# Patient Record
Sex: Female | Born: 2000 | Race: White | Hispanic: No | Marital: Single | Smoking: Never smoker
Health system: Southern US, Community
[De-identification: ages and names within clinical notes are randomized; demographics above are authoritative.]

## PROBLEM LIST (undated history)

## (undated) DIAGNOSIS — F32A Depression, unspecified: Secondary | ICD-10-CM

## (undated) DIAGNOSIS — D689 Coagulation defect, unspecified: Secondary | ICD-10-CM

## (undated) DIAGNOSIS — F419 Anxiety disorder, unspecified: Secondary | ICD-10-CM

## (undated) DIAGNOSIS — N946 Dysmenorrhea, unspecified: Secondary | ICD-10-CM

## (undated) DIAGNOSIS — Z8481 Family history of carrier of genetic disease: Secondary | ICD-10-CM

## (undated) HISTORY — PX: OTHER SURGICAL HISTORY: SHX169

## (undated) HISTORY — PX: EYE SURGERY: SHX253

## (undated) HISTORY — DX: Family history of carrier of genetic disease: Z84.81

## (undated) HISTORY — DX: Depression, unspecified: F32.A

## (undated) HISTORY — DX: Anxiety disorder, unspecified: F41.9

## (undated) HISTORY — DX: Coagulation defect, unspecified: D68.9

## (undated) HISTORY — DX: Dysmenorrhea, unspecified: N94.6

---
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
## 2020-05-30 ENCOUNTER — Ambulatory Visit: Payer: Self-pay | Attending: Internal Medicine

## 2020-05-30 DIAGNOSIS — Z23 Encounter for immunization: Secondary | ICD-10-CM

## 2020-05-30 NOTE — Progress Notes (Signed)
   Covid-19 Vaccination Clinic  Name:  Erin Montgomery    MRN: [id number] DOB: May 22, 2000  05/30/2020  [name] was observed post Covid-19 immunization for 30 minutes based on pre-vaccination screening without incident. She was provided with Vaccine Information Sheet and instruction to access the V-Safe system.   [name] was instructed to call 911 with any severe reactions post vaccine: Marland Kitchen Difficulty breathing  . Swelling of face and throat  . A fast heartbeat  . A bad rash all over body  . Dizziness and weakness   Immunizations Administered    Name Date Dose VIS Date Route   Moderna Covid-19 Booster Vaccine 05/30/2020  2:09 PM 0.25 mL 11/15/2019 Intramuscular   Manufacturer: Moderna   Lot: [id number]   [place]: 73419-379-02

## 2020-06-04 ENCOUNTER — Other Ambulatory Visit (HOSPITAL_BASED_OUTPATIENT_CLINIC_OR_DEPARTMENT_OTHER): Payer: Self-pay

## 2020-06-04 MED ORDER — MODERNA COVID-19 VACCINE 100 MCG/0.5ML IM SUSP
INTRAMUSCULAR | 0 refills | Status: DC
  Filled 2020-06-04: qty 0.25, 1d supply, fill #0

## 2020-06-06 ENCOUNTER — Encounter: Payer: Self-pay | Admitting: Nurse Practitioner

## 2020-06-06 ENCOUNTER — Other Ambulatory Visit: Payer: Self-pay

## 2020-06-06 ENCOUNTER — Ambulatory Visit (INDEPENDENT_AMBULATORY_CARE_PROVIDER_SITE_OTHER): Payer: 59 | Admitting: Nurse Practitioner

## 2020-06-06 VITALS — BP 109/70 | HR 85 | Temp 98.8°F | Ht 63.0 in | Wt 229.3 lb

## 2020-06-06 DIAGNOSIS — N912 Amenorrhea, unspecified: Secondary | ICD-10-CM | POA: Diagnosis not present

## 2020-06-06 DIAGNOSIS — F411 Generalized anxiety disorder: Secondary | ICD-10-CM | POA: Diagnosis not present

## 2020-06-06 DIAGNOSIS — F909 Attention-deficit hyperactivity disorder, unspecified type: Secondary | ICD-10-CM | POA: Diagnosis not present

## 2020-06-06 DIAGNOSIS — D6851 Activated protein C resistance: Secondary | ICD-10-CM | POA: Diagnosis not present

## 2020-06-06 DIAGNOSIS — Z9151 Personal history of suicidal behavior: Secondary | ICD-10-CM

## 2020-06-06 DIAGNOSIS — H538 Other visual disturbances: Secondary | ICD-10-CM

## 2020-06-06 DIAGNOSIS — Z86718 Personal history of other venous thrombosis and embolism: Secondary | ICD-10-CM

## 2020-06-06 DIAGNOSIS — Z7689 Persons encountering health services in other specified circumstances: Secondary | ICD-10-CM | POA: Diagnosis not present

## 2020-06-06 MED ORDER — APIXABAN 2.5 MG PO TABS: 2.5000 mg | ORAL_TABLET | Freq: Two times a day (BID) | ORAL | 1 refills | Status: DC

## 2020-06-06 NOTE — Progress Notes (Signed)
New Patient Office Visit  Subjective:  Patient ID: Erin Montgomery, adult    DOB: Jun 20, 2000  Age: 20 y.o. MRN: [id number]  CC:  Chief Complaint  Patient presents with  . New Patient (Initial Visit)    HPI SHALAMAR PLOURDE presents to establish new primary care provider. They are currently staying with their parents in the area while on summer break from college. They are getting ready to leave for an overseas trip to the [place] and [place]. This is a trip for school, studying the [place]. They plan to return to college and use findings to improve the [place] of [place]. The patient states that they just received 2nd booster for COVID 19.  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 At one point, they were taking Eliquis twice daily, but has been off of this medication. They do not believe they have developed any new blood clots. Has not seen hematology for some time.  They have long history of generalized anxiety disorder, depression, and suicide attempts. They have seen psychiatry and counseling services while on campus in [place]. They do not have mental health specialist of this sort in the area. They are currently not on any medications to treat anxiety and depression. They are currently not suicidal. Would like referral to psychiatry and mental health.  The patient states that they have not had menstrual cycle for the past 6 months or so. In the past, this has been mentioned to parents and previous provider. It was suggested that they may have polycystic ovary syndrome. No testing was ever performed to their knowledge. They state that family members have history of very irregular menstrual cycles as well.  The patient reports generalized joint pain and tenderness. This gets worse when standing for long times or with mild exertion. They state that many of the joints "pop and crack" without any good explanation.  The popping and cracking is painful.  The patient denies chest pain, chest pressure, shortness of breath or headaches. They do have intermittent periods of nausea and slight dizziness. They state that they have been evaluated for anemia and tests were negative. With further investigation, last full set of blood work was done a few years ago.   Past Medical History:  Diagnosis Date  . Anxiety   . Clotting disorder ([place])    Factor V Leiden  . Depression     History reviewed. No pertinent surgical history.  Family History  Problem Relation Age of Onset  . Alcoholism Mother   . Heart attack Mother   . Depression Mother   . High blood pressure Mother   . High Cholesterol Mother   . High blood pressure Father   . High Cholesterol Father   . Heart attack Maternal Grandmother   . Heart attack Maternal Grandfather   . Depression Maternal Grandfather   . Alcoholism Paternal Grandmother     Social History   Socioeconomic History  . Marital status: Single    Spouse name: Not on file  . Number of children: Not on file  . Years of education: Not on file  . Highest education level: Not on file  Occupational History  . Not on file  Tobacco Use  . Smoking status: Never Smoker  . Smokeless tobacco: Never Used  Substance and Sexual Activity  . Alcohol use: Never  . Drug use: Never  . Sexual activity: Not Currently  Other Topics Concern  .  Not on file  Social History Narrative  . Not on file   Social Determinants of Health   Financial Resource Strain: Not on file  Food Insecurity: Not on file  Transportation Needs: Not on file  Physical Activity: Not on file  Stress: Not on file  Social Connections: Not on file  Intimate Partner Violence: Not on file    ROS Review of Systems  Constitutional: Positive for fatigue. Negative for activity change, chills, fever and unexpected weight change.  HENT: Negative.  Negative for congestion and sinus pain.   Eyes: Negative.    Respiratory: Negative for cough and wheezing.   Cardiovascular: Negative for chest pain and palpitations.  Gastrointestinal: Positive for nausea. Negative for constipation, diarrhea and vomiting.  Endocrine: Negative for cold intolerance, heat intolerance, polydipsia and polyuria.  Genitourinary: Positive for menstrual problem.       Patient states they have not had a menstrual cycle in past 6 months or so.   Musculoskeletal: Positive for arthralgias and myalgias. Negative for back pain.       Generalized joint pain and popping and cracking of multiple joints.   Skin: Negative for rash.  Allergic/Immunologic: Negative.   Neurological: Positive for dizziness. Negative for weakness and headaches.  Hematological: Negative.   Psychiatric/Behavioral: Positive for dysphoric mood. The patient is nervous/anxious.   All other systems reviewed and are negative.   Objective:   Today's Vitals   06/06/20 1450  BP: 109/70  Pulse: 85  Temp: 98.8 F (37.1 C)  SpO2: 100%  Weight: 229 lb 4.8 oz (104 kg)  Height: 5\' 3"  (1.6 m)   Body mass index is 40.62 kg/m.  Physical Exam Vitals and nursing note reviewed.  Constitutional:      Appearance: Normal appearance. NIRVI BOEHLER "KJ" is well-developed. REIGHAN HIPOLITO "KJ" is obese.  HENT:     Head: Normocephalic and atraumatic.  Eyes:     Extraocular Movements: Extraocular movements intact.     Conjunctiva/sclera: Conjunctivae normal.     Pupils: Pupils are equal, round, and reactive to light.  Cardiovascular:     Rate and Rhythm: Normal rate and regular rhythm.     Pulses: Normal pulses.     Heart sounds: Normal heart sounds.  Pulmonary:     Effort: Pulmonary effort is normal.     Breath sounds: Normal breath sounds.  Abdominal:     Palpations: Abdomen is soft.  Musculoskeletal:        General: Normal range of motion.     Cervical back: Normal range of motion and neck supple.  Lymphadenopathy:     Cervical: No cervical adenopathy.   Skin:    General: Skin is warm and dry.     Capillary Refill: Capillary refill takes less than 2 seconds.  Neurological:     General: No focal deficit present.     Mental Status: RENITA BROCKS "KJ" is alert and oriented to person, place, and time.  Psychiatric:        Mood and Affect: Mood normal.        Behavior: Behavior normal.        Thought Content: Thought content normal.        Judgment: Judgment normal.     Assessment & Plan:  1. Encounter to establish care Appointment today to establish primary care provider  2. Factor V Leiden ([place]) Restart eliquis 2.5mg  twice daily. Refer to hematology for further evaluation and management.  - apixaban (ELIQUIS) 2.5 MG TABS tablet;  Take 1 tablet (2.5 mg total) by mouth 2 (two) times daily.  Dispense: 60 tablet; Refill: 1 - Ambulatory referral to Hematology / Oncology  3. History of DVT in adulthood Check D. Dimer with labs. Start preventive eliquis 2.5mg  twice daily. Refer to hematology for further evaluation and management - Ambulatory referral to Hematology / Oncology  4. Amenorrhea Check reproductive hormones including FSH, LH, estradiol, and prolactin level. Will consider getting pelvic ultrasound based on lab results.   5. Generalized anxiety disorder Will get referral to psychiatry for further evaluation and treatment.  - Ambulatory referral to Psychiatry  6. Attention deficit hyperactivity disorder (ADHD), unspecified ADHD type Referral made today to psychiatry for further evaluation and treatment.  - Ambulatory referral to Psychiatry  7. History of suicide attempt Patient currently not suicidal. Will refer to psychiatry for further evaluation and treatment.  - Ambulatory referral to Psychiatry  Problem List Items Addressed This Visit      Hematopoietic and Hemostatic   Factor V Leiden ([place])   Relevant Medications   apixaban (ELIQUIS) 2.5 MG TABS tablet   Other Relevant Orders   Ambulatory referral to Hematology /  Oncology     Other   Encounter to establish care - Primary   History of DVT in adulthood   Relevant Orders   Ambulatory referral to Hematology / Oncology   Amenorrhea   Generalized anxiety disorder   Relevant Orders   Ambulatory referral to Psychiatry   Attention deficit hyperactivity disorder (ADHD)   Relevant Orders   Ambulatory referral to Psychiatry   History of suicide attempt   Relevant Orders   Ambulatory referral to Psychiatry      Outpatient Encounter Medications as of 06/06/2020  Medication Sig  . apixaban (ELIQUIS) 2.5 MG TABS tablet Take 1 tablet (2.5 mg total) by mouth 2 (two) times daily.  Marland Kitchen COVID-19 mRNA vaccine, Moderna, (MODERNA COVID-19 VACCINE) 100 MCG/0.5ML injection Inject into the muscle.   No facility-administered encounter medications on file as of 06/06/2020.    Follow-up: Return in about 1 day (around 06/07/2020) for labs- cbc, ana,RA, sed rate, d. dimer, cmet, estrogen, FSH/LH,  prolactin, lipid (see below).   Ronnell Freshwater, NP

## 2020-06-06 NOTE — Patient Instructions (Signed)
Polycystic Ovary Syndrome  Polycystic ovarian syndrome (PCOS) is a common hormonal disorder among women of reproductive age. In most women with PCOS, small fluid-filled sacs (cysts) grow on the ovaries. PCOS can cause problems with menstrual periods and make it hard to get and stay pregnant. If this condition is not treated, it can lead to serious health problems, such as diabetes and heart disease. What are the causes? The cause of this condition is not known. It may be due to certain factors, such as:  Irregular menstrual cycle.  High levels of certain hormones.  Problems with the hormone that helps to control blood sugar (insulin).  Certain genes. What increases the risk? You are more likely to develop this condition if you:  Have a family history of PCOS or type 2 diabetes.  Are overweight, eat unhealthy foods, and are not active. These factors may cause problems with blood sugar control, which can contribute to PCOS or PCOS symptoms. What are the signs or symptoms? Symptoms of this condition include:  Ovarian cysts and sometimes pelvic pain.  Menstrual periods that are not regular or are too heavy.  Inability to get or stay pregnant.  Increased growth of hair on the face, chest, stomach, back, thumbs, thighs, or toes.  Acne or oily skin. Acne may develop during adulthood, and it may not get better with treatment.  Weight gain or obesity.  Patches of thickened and dark brown or black skin on the neck, arms, breasts, or thighs. How is this diagnosed? This condition is diagnosed based on:  Your medical history.  A physical exam that includes a pelvic exam. Your health care provider may look for areas of increased hair growth on your skin.  Tests, such as: ? An ultrasound to check the ovaries for cysts and to view the lining of the uterus. ? Blood tests to check levels of sugar (glucose), female hormone (testosterone), and female hormones (estrogen and progesterone). How  is this treated? There is no cure for this condition, but treatment can help to manage symptoms and prevent more health problems from developing. Treatment varies depending on your symptoms and if you want to have a baby or if you need birth control. Treatment may include:  Making nutrition and lifestyle changes.  Taking the progesterone hormone to start a menstrual period.  Taking birth control pills to help you have regular menstrual periods.  Taking medicines such as: ? Medicines to make you ovulate, if you want to get pregnant. ? Medicine to reduce extra hair growth.  Having surgery in severe cases. This may involve making small holes in one or both of your ovaries. This decreases the amount of testosterone that your body makes. Follow these instructions at home:  Take over-the-counter and prescription medicines only as told by your health care provider.  Follow a healthy meal plan that includes lean proteins, complex carbohydrates, fresh fruits and vegetables, low-fat dairy products, healthy fats, and fiber.  If you are overweight, lose weight as told by your health care provider. Your health care provider can determine how much weight loss is best for you and can help you lose weight safely.  Keep all follow-up visits. This is important. Contact a health care provider if:  Your symptoms do not get better with medicine.  Your symptoms get worse or you develop new symptoms. Summary  Polycystic ovarian syndrome (PCOS) is a common hormonal disorder among women of reproductive age.  PCOS can cause problems with menstrual periods and make it hard  to get and stay pregnant.  If this condition is not treated, it can lead to serious health problems, such as diabetes and heart disease.  There is no cure for this condition, but treatment can help to manage symptoms and prevent more health problems from developing. This information is not intended to replace advice given to you by your  health care provider. Make sure you discuss any questions you have with your health care provider. Document Revised: 06/22/2019 Document Reviewed: 06/22/2019 Elsevier Patient Education  2021 [employer].

## 2020-06-07 ENCOUNTER — Other Ambulatory Visit: Payer: Self-pay

## 2020-06-07 ENCOUNTER — Encounter: Payer: Self-pay | Admitting: Nurse Practitioner

## 2020-06-07 DIAGNOSIS — Z86718 Personal history of other venous thrombosis and embolism: Secondary | ICD-10-CM

## 2020-06-07 DIAGNOSIS — F909 Attention-deficit hyperactivity disorder, unspecified type: Secondary | ICD-10-CM | POA: Insufficient documentation

## 2020-06-07 DIAGNOSIS — Z0000 Encounter for general adult medical examination without abnormal findings: Secondary | ICD-10-CM

## 2020-06-07 DIAGNOSIS — N912 Amenorrhea, unspecified: Secondary | ICD-10-CM | POA: Insufficient documentation

## 2020-06-07 DIAGNOSIS — D6851 Activated protein C resistance: Secondary | ICD-10-CM

## 2020-06-07 DIAGNOSIS — Z7689 Persons encountering health services in other specified circumstances: Secondary | ICD-10-CM | POA: Diagnosis not present

## 2020-06-07 DIAGNOSIS — Z9151 Personal history of suicidal behavior: Secondary | ICD-10-CM | POA: Insufficient documentation

## 2020-06-07 DIAGNOSIS — F411 Generalized anxiety disorder: Secondary | ICD-10-CM | POA: Insufficient documentation

## 2020-06-08 ENCOUNTER — Encounter: Payer: Self-pay | Admitting: Nurse Practitioner

## 2020-06-09 ENCOUNTER — Encounter: Payer: Self-pay | Admitting: Nurse Practitioner

## 2020-06-09 NOTE — Progress Notes (Signed)
D.dimer mildly elevated. History of Factor V Leiden. Restarted eliquis 2.5mg  twice daily. Will get further evaluation when she returns from overseas trip. Prolactin also elevated. Likely contributing to amenorrhea. Will get CT or MRI of the brain for evaluation of pituitary upon return to office. MyChart message sent to patient.

## 2020-06-10 LAB — COMPREHENSIVE METABOLIC PANEL
ALT: 32 IU/L (ref 0–32)
AST: 18 IU/L (ref 0–40)
Albumin/Globulin Ratio: 1.9 (ref 1.2–2.2)
Albumin: 4.6 g/dL (ref 3.9–5.0)
Alkaline Phosphatase: 88 IU/L (ref 42–106)
BUN/Creatinine Ratio: 22 (ref 9–23)
BUN: 15 mg/dL (ref 6–20)
Bilirubin Total: 0.3 mg/dL (ref 0.0–1.2)
CO2: 22 mmol/L (ref 20–29)
Calcium: 10.1 mg/dL (ref 8.7–10.2)
Chloride: 99 mmol/L (ref 96–106)
Creatinine, Ser: 0.68 mg/dL (ref 0.57–1.00)
Globulin, Total: 2.4 g/dL (ref 1.5–4.5)
Glucose: 97 mg/dL (ref 65–99)
Potassium: 4.4 mmol/L (ref 3.5–5.2)
Sodium: 141 mmol/L (ref 134–144)
Total Protein: 7 g/dL (ref 6.0–8.5)
eGFR: 128 mL/min/{1.73_m2} (ref >59)

## 2020-06-10 LAB — PROLACTIN: Prolactin: 46.5 ng/mL — ABNORMAL HIGH (ref 4.8–23.3)

## 2020-06-10 LAB — RHEUMATOID FACTOR: Rheumatoid fact SerPl-aCnc: <10 IU/mL (ref <14.0)

## 2020-06-10 LAB — LIPID PANEL
Chol/HDL Ratio: 4 ratio (ref 0.0–4.4)
Cholesterol, Total: 162 mg/dL (ref 100–199)
HDL: 41 mg/dL (ref >39)
LDL Chol Calc (NIH): 95 mg/dL (ref 0–99)
Triglycerides: 147 mg/dL (ref 0–149)
VLDL Cholesterol Cal: 26 mg/dL (ref 5–40)

## 2020-06-10 LAB — CBC
Hematocrit: 43.6 % (ref 34.0–46.6)
Hemoglobin: 14.8 g/dL (ref 11.1–15.9)
MCH: 29.9 pg (ref 26.6–33.0)
MCHC: 33.9 g/dL (ref 31.5–35.7)
MCV: 88 fL (ref 79–97)
Platelets: 293 10*3/uL (ref 150–450)
RBC: 4.95 x10E6/uL (ref 3.77–5.28)
RDW: 13.2 % (ref 11.7–15.4)
WBC: 8 10*3/uL (ref 3.4–10.8)

## 2020-06-10 LAB — D-DIMER, QUANTITATIVE: D-DIMER: 0.57 mg/L FEU — ABNORMAL HIGH (ref 0.00–0.49)

## 2020-06-10 LAB — FOLLICLE STIMULATING HORMONE: FSH: 21.6 m[IU]/mL

## 2020-06-10 LAB — LUTEINIZING HORMONE: LH: 71.7 m[IU]/mL

## 2020-06-10 LAB — ANA: Anti Nuclear Antibody (ANA): NEGATIVE

## 2020-06-10 LAB — ESTROGENS, TOTAL: Estrogen: 420 pg/mL

## 2020-06-10 LAB — SEDIMENTATION RATE: Sed Rate: 6 mm/hr (ref 0–32)

## 2020-06-14 ENCOUNTER — Other Ambulatory Visit (HOSPITAL_COMMUNITY): Payer: Self-pay

## 2020-06-28 ENCOUNTER — Other Ambulatory Visit: Payer: Self-pay

## 2020-06-28 ENCOUNTER — Ambulatory Visit (INDEPENDENT_AMBULATORY_CARE_PROVIDER_SITE_OTHER): Payer: 59 | Admitting: Nurse Practitioner

## 2020-06-28 ENCOUNTER — Encounter: Payer: Self-pay | Admitting: Nurse Practitioner

## 2020-06-28 VITALS — BP 117/71 | HR 74 | Temp 97.4°F | Ht 63.0 in | Wt 226.1 lb

## 2020-06-28 DIAGNOSIS — R7989 Other specified abnormal findings of blood chemistry: Secondary | ICD-10-CM | POA: Diagnosis not present

## 2020-06-28 DIAGNOSIS — H53002 Unspecified amblyopia, left eye: Secondary | ICD-10-CM | POA: Diagnosis not present

## 2020-06-28 DIAGNOSIS — D6851 Activated protein C resistance: Secondary | ICD-10-CM

## 2020-06-28 DIAGNOSIS — Z86718 Personal history of other venous thrombosis and embolism: Secondary | ICD-10-CM

## 2020-06-28 NOTE — Progress Notes (Signed)
Established Patient Office Visit  Subjective:  Patient ID: Erin Montgomery, adult    DOB: 08-May-2000  Age: 20 y.o. MRN: [id number]  CC:  Chief Complaint  Patient presents with   Follow-up    HPI Erin Montgomery presents for follow up of labs. Was found to have elevated D. Dimer.she was started on Eliquis at her most recent visit and referred to hematologist for further evaluation. Does have history of Factor V Leiden disorder which may explain the abnormal lab. She has no leg pain or cramping. She has not chest pain or shortness of breath. Blood pressure is well controlled without any medication.  She had elevated prolactin level. May indicate pituitary microadenoma. Will get MRI of the brain for further evaluation.  Needs referral to ophthalmology. 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 Needs to have checked per ophthalmology once a year for surveillance.  She states that she has no concerns or complaints today. She denies chest pain, chest pressure, or shortness of breath. She denies headaches or visual disturbances. She denies abdominal pain, nausea, vomiting, or changes in bowel or bladder habits.    Past Medical History:  Diagnosis Date   Anxiety    Clotting disorder ([place])    Factor V Leiden   Depression     History reviewed. No pertinent surgical history.  Family History  Problem Relation Age of Onset   Alcoholism Mother    Heart attack Mother    Depression Mother    High blood pressure Mother    High Cholesterol Mother    High blood pressure Father    High Cholesterol Father    Heart attack Maternal Grandmother    Heart attack Maternal Grandfather    Depression Maternal Grandfather    Alcoholism Paternal Grandmother     Social History   Socioeconomic History   Marital status: Single    Spouse name: Not on file   Number of children: Not on file   Years of education: Not on file   Highest education level: Not on file   Occupational History   Not on file  Tobacco Use   Smoking status: Never   Smokeless tobacco: Never  Substance and Sexual Activity   Alcohol use: Never   Drug use: Never   Sexual activity: Not Currently  Other Topics Concern   Not on file  Social History Narrative   Not on file   Social Determinants of Health   Financial Resource Strain: Not on file  Food Insecurity: Not on file  Transportation Needs: Not on file  Physical Activity: Not on file  Stress: Not on file  Social Connections: Not on file  Intimate Partner Violence: Not on file    Outpatient Medications Prior to Visit  Medication Sig Dispense Refill   apixaban (ELIQUIS) 2.5 MG TABS tablet Take 1 tablet (2.5 mg total) by mouth 2 (two) times daily. 60 tablet 1   COVID-19 mRNA vaccine, Moderna, (MODERNA COVID-19 VACCINE) 100 MCG/0.5ML injection Inject into the muscle. 0.25 mL 0   No facility-administered medications prior to visit.    Not on File  ROS Review of Systems  Constitutional:  Negative for activity change, appetite change, chills, fatigue and fever.  HENT:  Negative for congestion, postnasal drip, rhinorrhea, sinus pressure and sinus pain.   Eyes: Negative.   Respiratory:  Negative for cough, chest tightness, shortness of breath and wheezing.   Cardiovascular:  Negative for chest pain and  palpitations.  Gastrointestinal:  Negative for constipation, diarrhea, nausea and vomiting.  Endocrine: Negative for cold intolerance, heat intolerance, polydipsia and polyuria.       Elevated prolactin level  Genitourinary:        Absence of menstrual cycles for past several months.   Musculoskeletal:  Negative for back pain and myalgias.  Skin:  Negative for rash.  Allergic/Immunologic: Negative.   Neurological:  Negative for dizziness, weakness and headaches.  Hematological:        Handling anxiety well with personal coping mechanisms.   Psychiatric/Behavioral:  The patient is nervous/anxious.       Objective:    Physical Exam Vitals and nursing note reviewed.  Constitutional:      Appearance: Normal appearance. Erin BEVACQUA "KJ" is well-developed. Erin BUTRICK "KJ" is obese.  HENT:     Head: Normocephalic and atraumatic.     Nose: Nose normal.     Mouth/Throat:     Mouth: Mucous membranes are moist.  Eyes:     Extraocular Movements: Extraocular movements intact.     Conjunctiva/sclera: Conjunctivae normal.     Pupils: Pupils are equal, round, and reactive to light.  Cardiovascular:     Rate and Rhythm: Normal rate and regular rhythm.     Pulses: Normal pulses.     Heart sounds: Normal heart sounds.  Pulmonary:     Effort: Pulmonary effort is normal.     Breath sounds: Normal breath sounds.  Abdominal:     Palpations: Abdomen is soft.  Musculoskeletal:        General: Normal range of motion.     Cervical back: Normal range of motion and neck supple.  Lymphadenopathy:     Cervical: No cervical adenopathy.  Skin:    General: Skin is warm and dry.     Capillary Refill: Capillary refill takes less than 2 seconds.  Neurological:     General: No focal deficit present.     Mental Status: Erin SOTERO "KJ" is alert and oriented to person, place, and time.  Psychiatric:        Mood and Affect: Mood normal.        Behavior: Behavior normal.        Thought Content: Thought content normal.        Judgment: Judgment normal.    Today's Vitals   06/28/20 1102  BP: 117/71  Pulse: 74  Temp: (!) 97.4 F (36.3 C)  SpO2: 99%  Weight: 226 lb 1.6 oz (102.6 kg)   Body mass index is 40.05 kg/m.  Wt Readings from Last 3 Encounters:  06/28/20 226 lb 1.6 oz (102.6 kg)  06/06/20 229 lb 4.8 oz (104 kg)     Health Maintenance Due  Topic Date Due   HPV VACCINES (1 - 2-dose series) Never done   HIV Screening  Never done   Hepatitis C Screening  Never done   TETANUS/TDAP  Never done   COVID-19 Vaccine (2 - Moderna series) 06/27/2020       Topic Date Due   HPV VACCINES  (1 - 2-dose series) Never done    No results found for: TSH Lab Results  Component Value Date   WBC 8.0 06/07/2020   HGB 14.8 06/07/2020   HCT 43.6 06/07/2020   MCV 88 06/07/2020   PLT 293 06/07/2020   Lab Results  Component Value Date   NA 141 06/07/2020   K 4.4 06/07/2020   CO2 22 06/07/2020   GLUCOSE 97  06/07/2020   BUN 15 06/07/2020   CREATININE 0.68 06/07/2020   BILITOT 0.3 06/07/2020   ALKPHOS 88 06/07/2020   AST 18 06/07/2020   ALT 32 06/07/2020   PROT 7.0 06/07/2020   ALBUMIN 4.6 06/07/2020   CALCIUM 10.1 06/07/2020   EGFR 128 06/07/2020   Lab Results  Component Value Date   CHOL 162 06/07/2020   Lab Results  Component Value Date   HDL 41 06/07/2020   Lab Results  Component Value Date   LDLCALC 95 06/07/2020   Lab Results  Component Value Date   TRIG 147 06/07/2020   Lab Results  Component Value Date   CHOLHDL 4.0 06/07/2020   No results found for: HGBA1C    Assessment & Plan:  1. Elevated prolactin level Elevated prolactin level may indicate pituitary microadenoma. Will get MRI of the brain and refer as indicate.d  - MR Brain Wo Contrast; Future  2. 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 Will refer to ophthalmologist for further evaluation and treatment.  - Ambulatory referral to Ophthalmology  3. History of DVT in adulthood Elevated D-Dimer on recent labs. Was started on eliquis 2.62m twice daily and referred to hematology for further evaluation.   4. Factor V Leiden ([facility] Patient referred to hematology for surveillance and continued management.    Problem List Items Addressed This Visit       Hematopoietic and Hemostatic   Factor V Leiden ([place]     Other   History of DVT in adulthood   Elevated prolactin level - Primary   Relevant Orders   MR Brain Wo Contrast   Lazy eye of left side   Relevant Orders   Ambulatory referral to Ophthalmology     Follow-up: Return in about 4 weeks (around 07/26/2020) for mri  results .    HRonnell Freshwater NP

## 2020-06-28 NOTE — Patient Instructions (Signed)
Prolactin Level Test Why am I having this test? The prolactin level test is often used to diagnose and monitor problems with the pituitary gland, such as pituitary tumors. It may also be used to help find the cause of certain other conditions, such as an abnormal absence of menstrual cycles (amenorrhea) or a thyroid gland that does not produce enough hormones (hypothyroidism). Your health care provider may order this test if you have:  Irregular menstrual periods.  Loss of libido.  Milky fluid coming from your nipples (when not breastfeeding).  Fatigue. What is being tested? This test measures the amount of prolactin in your blood. Prolactin is a hormone that is produced by the pituitary gland. Prolactin levels normally go up and down (fluctuate) due to stress, illness, trauma, or surgery. Increased levels can also be caused by tumors or other health problems. What kind of sample is taken? A blood sample is required for this test. It is usually collected by inserting a needle into a blood vessel.   Tell a health care provider about:  All medicines you are taking, including vitamins, herbs, eye drops, creams, and over-the-counter medicines. How are the results reported? Your test results will be reported as values that indicate the amount of prolactin in your blood. Your health care provider will compare your results to normal ranges that were established after testing a large group of people (reference ranges). Reference ranges may vary among labs and hospitals. For this test, common reference ranges are:  Adult female: 3-13 ng/mL.  Adult female: 3-27 ng/mL.  Pregnant female: 20-400 ng/mL. What do the results mean? Increased levels of prolactin may mean that you have:  A pituitary gland tumor.  Amenorrhea.  Hypothyroidism.  Certain pituitary or reproductive syndromes.  Kidney failure. Decreased levels of prolactin may indicate:  Lack of blood to the pituitary  gland.  Pituitary gland failure. Talk with your health care provider about what your results mean. Questions to ask your health care provider Ask your health care provider, or the department that is doing the test:  When will my results be ready?  How will I get my results?  What are my treatment options?  What other tests do I need?  What are my next steps? Summary  The prolactin level test is often used to diagnose and monitor problems with the pituitary gland, such as pituitary tumors. It may also be used to help find the cause of certain other conditions, such as amenorrhea or hypothyroidism.  This test measures the amount of prolactin in your blood. Prolactin is a hormone that is produced by the pituitary gland.  Prolactin levels normally go up and down (fluctuate) due to stress, illness, trauma, or surgery. Increased levels can also be caused by tumors or other health problems.  Talk with your health care provider about what your results mean. This information is not intended to replace advice given to you by your health care provider. Make sure you discuss any questions you have with your health care provider. Document Revised: 09/07/2016 Document Reviewed: 09/07/2016 Elsevier Patient Education  2021 [employer].

## 2020-07-02 NOTE — Progress Notes (Signed)
Discussed at length with patient at follow up visit

## 2020-07-07 DIAGNOSIS — H53002 Unspecified amblyopia, left eye: Secondary | ICD-10-CM | POA: Insufficient documentation

## 2020-07-07 DIAGNOSIS — R7989 Other specified abnormal findings of blood chemistry: Secondary | ICD-10-CM | POA: Insufficient documentation

## 2020-07-22 ENCOUNTER — Ambulatory Visit
Admission: RE | Admit: 2020-07-22 | Discharge: 2020-07-22 | Disposition: A | Discharge status: Home / Self Care | Payer: 59 | Source: Ambulatory Visit | Attending: Nurse Practitioner | Admitting: Nurse Practitioner

## 2020-07-22 ENCOUNTER — Other Ambulatory Visit: Payer: Self-pay

## 2020-07-22 DIAGNOSIS — G939 Disorder of brain, unspecified: Secondary | ICD-10-CM | POA: Diagnosis not present

## 2020-07-22 DIAGNOSIS — R7989 Other specified abnormal findings of blood chemistry: Secondary | ICD-10-CM

## 2020-07-22 IMAGING — MR MR HEAD W/O CM
10 series · 48 of 48 positions shown · non-contrast
Comparison: None.

CLINICAL DATA: Brain mass or lesion elevated prolactin

EXAM:
MRI HEAD WITHOUT CONTRAST
TECHNIQUE: Multiplanar, multiecho pulse sequences of the brain and surrounding
structures were obtained without intravenous contrast.

[Series 5: T1 · sagittal · 4.0mm · 0.72mm/px · 3 of 27 slices shown (1 of 3)]
[im 1/27]
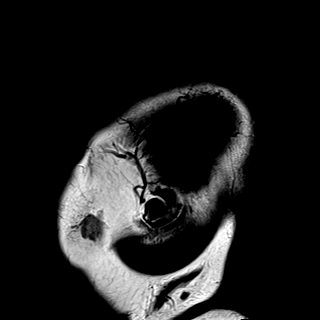
[im 14/27]
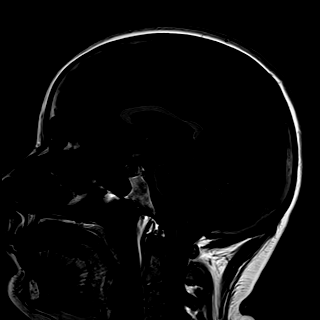
[im 27/27]
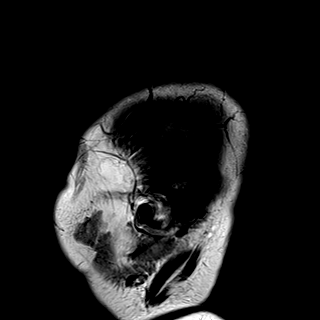

[Series 6: DWI · axial · 3.0mm · 0.94mm/px · z∈[-67,+72]mm · 17 of 160 slices shown]
[im 1/160]
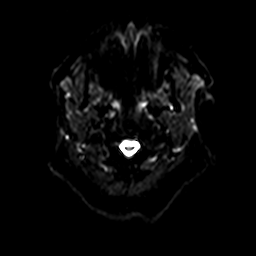
[im 10/160]
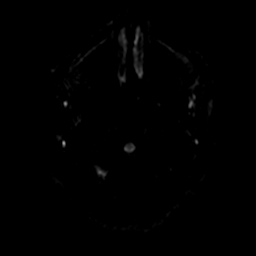
[im 20/160]
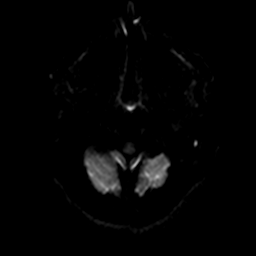
[im 30/160]
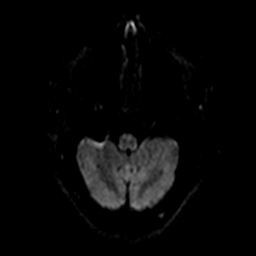
[im 40/160]
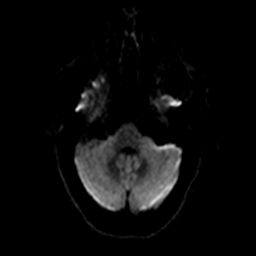
[im 50/160]
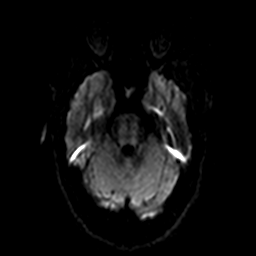
[im 60/160]
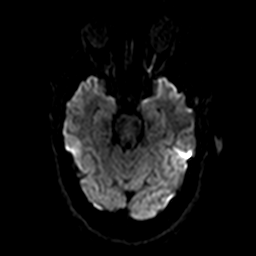
[im 70/160]
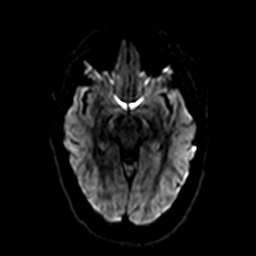
[im 80/160]
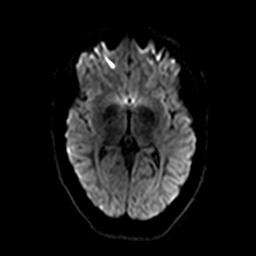
[im 90/160]
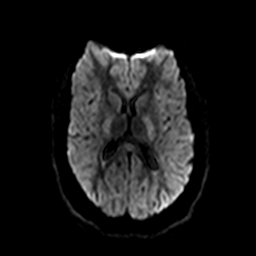
[im 100/160]
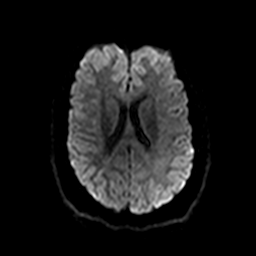
[im 110/160]
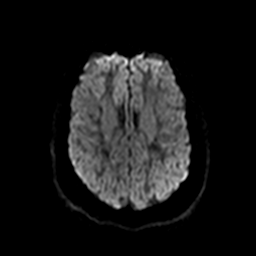
[im 120/160]
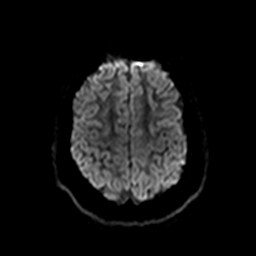
[im 130/160]
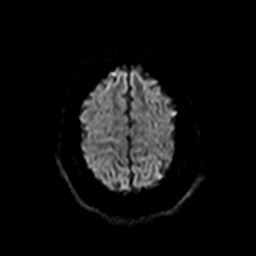
[im 140/160]
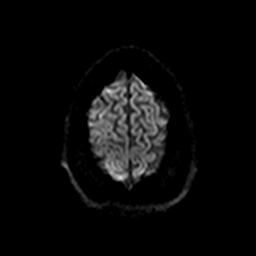
[im 150/160]
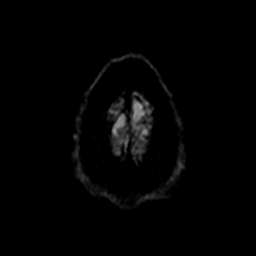
[im 160/160]
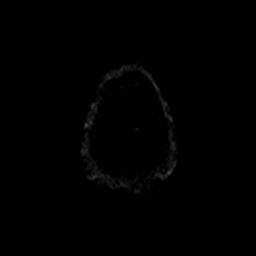

[Series 7: ax dwi_tracew · axial · 3.0mm · 0.94mm/px · z∈[-67,+72]mm · 8 of 80 slices shown]
[im 1/80]
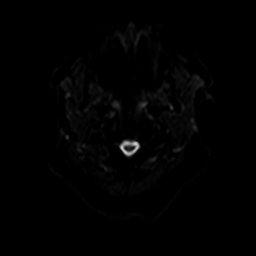
[im 12/80]
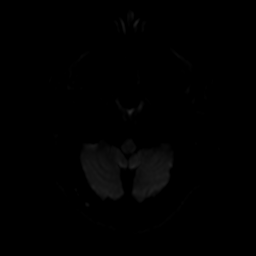
[im 23/80]
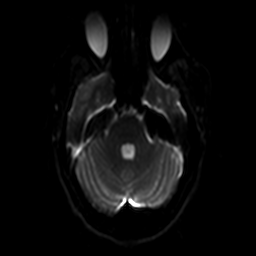
[im 34/80]
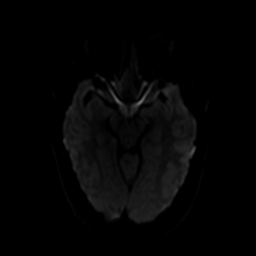
[im 46/80]
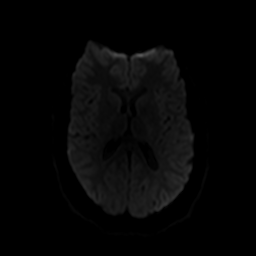
[im 57/80]
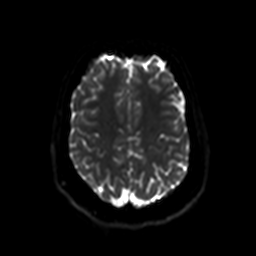
[im 68/80]
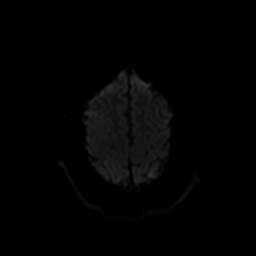
[im 80/80]
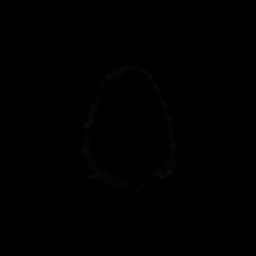

[Series 8: ax dwi_adc · axial · 3.0mm · 0.94mm/px · z∈[-67,+72]mm · 4 of 40 slices shown]
[im 1/40]
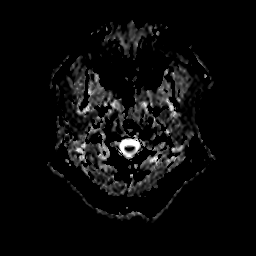
[im 14/40]
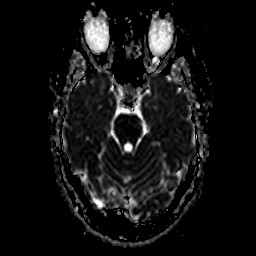
[im 27/40]
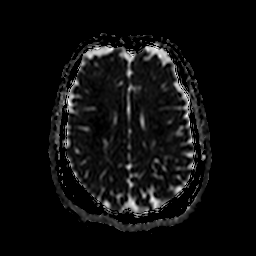
[im 40/40]
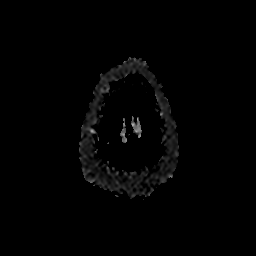

[Series 9: T2 · axial · 4.0mm · 0.36mm/px · z∈[-65,+69]mm · 3 of 27 slices shown (1 of 2)]
[im 1/27]
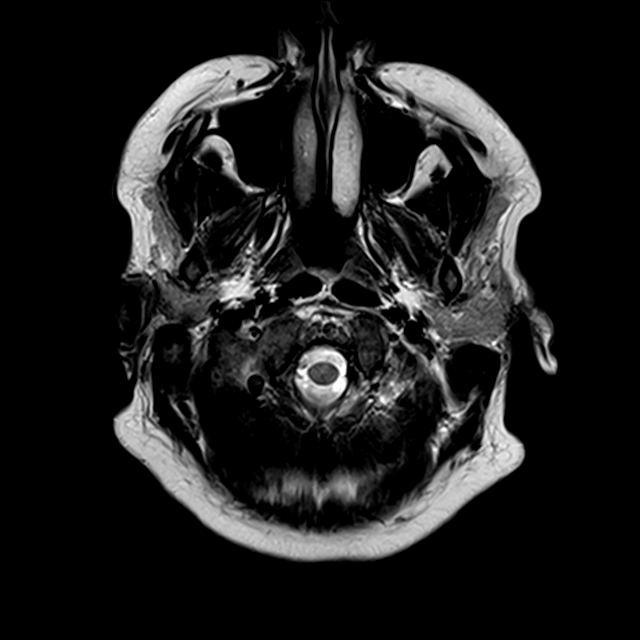
[im 14/27]
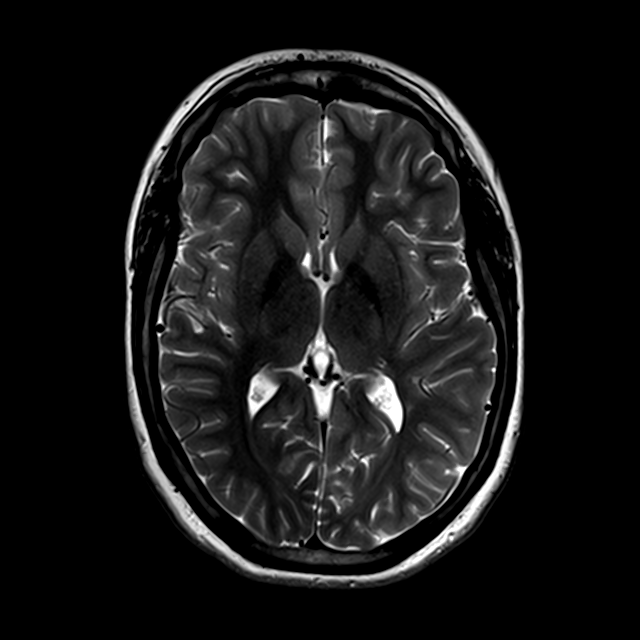
[im 27/27]
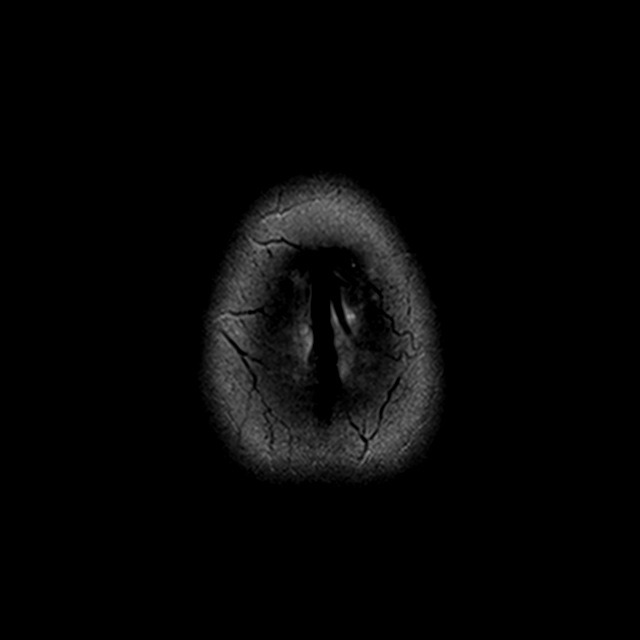

[Series 10: swi_images · axial · 2.2mm · 0.90mm/px · z∈[-67,+71]mm · 7 of 64 slices shown]
[im 1/64]
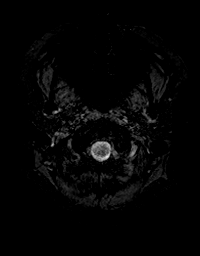
[im 11/64]
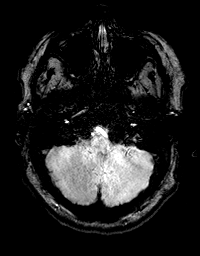
[im 22/64]
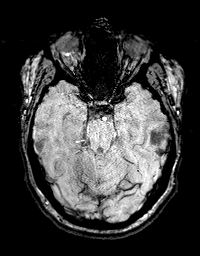
[im 32/64]
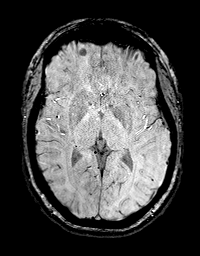
[im 43/64]
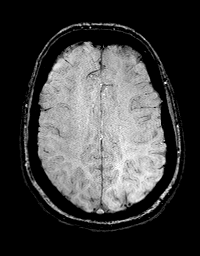
[im 53/64]
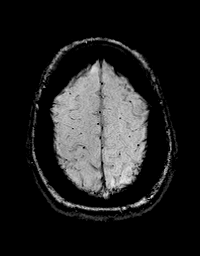
[im 64/64]
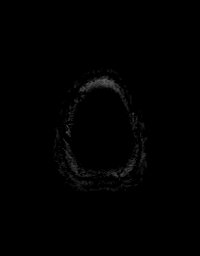

[Series 12: FLAIR · axial · 3.0mm · 0.72mm/px · z∈[-67,+70]mm · 3 of 24 slices shown]
[im 1/24]
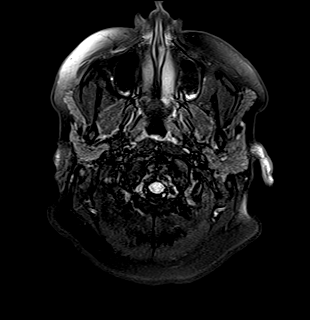
[im 12/24]
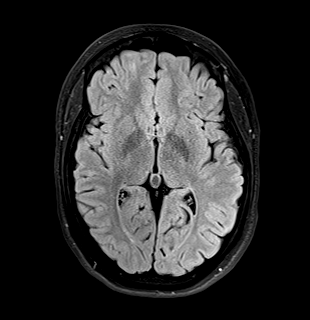
[im 24/24]
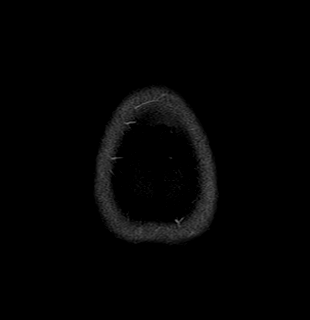

[Series 13: T1 · sagittal · 3.0mm · 0.42mm/px · 1 of 13 slices shown (2 of 3)]
[im 1/13]
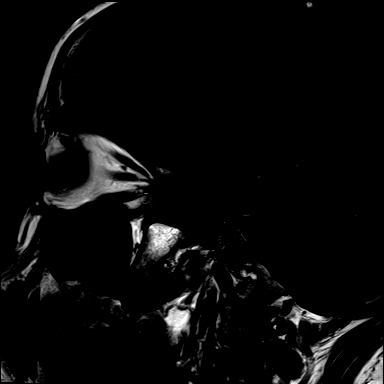

[Series 14: T1 · coronal · 3.0mm · 0.42mm/px · 1 of 10 slices shown (3 of 3)]
[im 1/10]
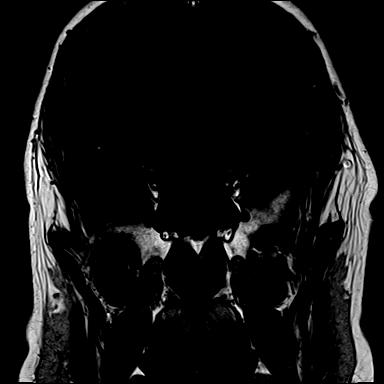

[Series 15: T2 · coronal · 3.0mm · 0.42mm/px · 1 of 10 slices shown (2 of 2)]
[im 1/10]
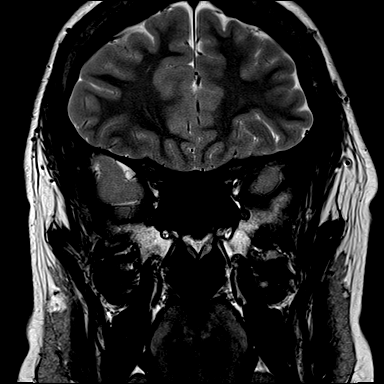

[48 of 48 positions shown; findings below may reference images not displayed]

FINDINGS: Brain: No acute infarct, mass effect or extra-axial collection. No
acute or chronic hemorrhage. There is multifocal hyperintense
T2-weighted signal within the white matter. Parenchymal volume and
CSF spaces are normal. The size of the pituitary gland is normal for
age. Otherwise limited assessment in the absence of IV contrast
material.

Vascular: Major flow voids are preserved.

Skull and upper cervical spine: Normal calvarium and skull base.
Visualized upper cervical spine and soft tissues are normal.

Sinuses/Orbits:No paranasal sinus fluid levels or advanced mucosal
thickening. No mastoid or middle ear effusion. Normal orbits.
IMPRESSION: 1. No acute intracranial abnormality.
2. Normal size of the pituitary gland for age. Otherwise, limited
assessment in the absence of IV contrast material.
3. Multifocal hyperintense T2-weighted signal within the white
matter, nonspecific. This may be seen in the setting of chronic
small vessel disease, migraine headaches or other remote
infectious/inflammatory process.

## 2020-07-23 ENCOUNTER — Other Ambulatory Visit: Payer: Self-pay | Admitting: Nurse Practitioner

## 2020-07-23 NOTE — Progress Notes (Signed)
Discuss with patent at visit 7/1. ? Migraine headaches ?inflammatory process, RA

## 2020-07-26 ENCOUNTER — Encounter: Payer: Self-pay | Admitting: Nurse Practitioner

## 2020-07-26 ENCOUNTER — Ambulatory Visit: Payer: 59 | Admitting: Nurse Practitioner

## 2020-07-26 ENCOUNTER — Other Ambulatory Visit: Payer: Self-pay

## 2020-07-26 VITALS — BP 122/77 | HR 88 | Temp 97.5°F | Ht 63.0 in | Wt 227.4 lb

## 2020-07-26 DIAGNOSIS — R7989 Other specified abnormal findings of blood chemistry: Secondary | ICD-10-CM | POA: Diagnosis not present

## 2020-07-26 DIAGNOSIS — H53002 Unspecified amblyopia, left eye: Secondary | ICD-10-CM

## 2020-07-26 DIAGNOSIS — D6851 Activated protein C resistance: Secondary | ICD-10-CM | POA: Diagnosis not present

## 2020-07-26 DIAGNOSIS — Z308 Encounter for other contraceptive management: Secondary | ICD-10-CM | POA: Diagnosis not present

## 2020-07-26 NOTE — Progress Notes (Signed)
Established Patient Office Visit  Subjective:  Patient ID: Erin Montgomery, adult    DOB: 03/21/2000  Age: 20 y.o. MRN: [id number]  CC:  Chief Complaint  Patient presents with   Follow-up    HPI Erin Montgomery presents for follow-up visit.  She recently had an MRI of the brain due to elevated prolactin level.  She was being evaluated for potential abnormality of pituitary gland.  MRI was normal.  There is some evidence of inflammation or potential migraine headaches.  No other abnormalities were seen in MRI.  Patient states she is generally doing okay.  She would like to have a referral to GYN.  She would like to have a consultation for tubal ligation. She has no new concerns or complaints today.she denies chest pain, chest pressure, or shortness of breath. He denies headaches or visual disturbances. He denies abdominal pain, nausea, vomiting, or changes in bowel or bladder habits.    Past Medical History:  Diagnosis Date   Anxiety    Clotting disorder ([place])    Factor V Leiden   Depression     History reviewed. No pertinent surgical history.  Family History  Problem Relation Age of Onset   Alcoholism Mother    Heart attack Mother    Depression Mother    High blood pressure Mother    High Cholesterol Mother    High blood pressure Father    High Cholesterol Father    Heart attack Maternal Grandmother    Heart attack Maternal Grandfather    Depression Maternal Grandfather    Alcoholism Paternal Grandmother     Social History   Socioeconomic History   Marital status: Single    Spouse name: Not on file   Number of children: Not on file   Years of education: Not on file   Highest education level: Not on file  Occupational History   Not on file  Tobacco Use   Smoking status: Never   Smokeless tobacco: Never  Substance and Sexual Activity   Alcohol use: Never   Drug use: Never   Sexual activity: Not Currently  Other Topics Concern   Not on file  Social History  Narrative   Not on file   Social Determinants of Health   Financial Resource Strain: Not on file  Food Insecurity: Not on file  Transportation Needs: Not on file  Physical Activity: Not on file  Stress: Not on file  Social Connections: Not on file  Intimate Partner Violence: Not on file    Outpatient Medications Prior to Visit  Medication Sig Dispense Refill   apixaban (ELIQUIS) 2.5 MG TABS tablet Take 1 tablet (2.5 mg total) by mouth 2 (two) times daily. 60 tablet 1   COVID-19 mRNA vaccine, Moderna, (MODERNA COVID-19 VACCINE) 100 MCG/0.5ML injection Inject into the muscle. 0.25 mL 0   No facility-administered medications prior to visit.    Not on File  ROS Review of Systems  Constitutional:  Negative for activity change, appetite change, chills, fatigue and fever.  HENT:  Negative for congestion, postnasal drip, rhinorrhea, sinus pressure and sinus pain.   Eyes: Negative.   Respiratory:  Negative for cough, chest tightness, shortness of breath and wheezing.   Cardiovascular:  Negative for chest pain and palpitations.  Gastrointestinal:  Negative for constipation, diarrhea and vomiting.  Endocrine: Negative for cold intolerance, heat intolerance, polydipsia and polyuria.  Genitourinary:        Patient would like to have referral to gynecologist to consult  for tubal ligation.  Musculoskeletal:  Positive for arthralgias. Negative for back pain and myalgias.  Skin:  Negative for rash.  Allergic/Immunologic: Negative.   Neurological:  Negative for dizziness, weakness and headaches.  Psychiatric/Behavioral:  The patient is nervous/anxious.      Objective:    Physical Exam Vitals and nursing note reviewed.  Constitutional:      Appearance: Normal appearance. Erin Montgomery "KJ" is well-developed. Erin Montgomery "KJ" is obese.  HENT:     Head: Normocephalic and atraumatic.     Nose: Nose normal.     Mouth/Throat:     Mouth: Mucous membranes are moist.  Eyes:      Extraocular Movements: Extraocular movements intact.     Conjunctiva/sclera: Conjunctivae normal.     Pupils: Pupils are equal, round, and reactive to light.  Cardiovascular:     Rate and Rhythm: Normal rate and regular rhythm.     Pulses: Normal pulses.     Heart sounds: Normal heart sounds.  Pulmonary:     Effort: Pulmonary effort is normal.     Breath sounds: Normal breath sounds.  Abdominal:     Palpations: Abdomen is soft.  Musculoskeletal:        General: Normal range of motion.     Cervical back: Normal range of motion and neck supple.  Lymphadenopathy:     Cervical: No cervical adenopathy.  Skin:    General: Skin is warm and dry.     Capillary Refill: Capillary refill takes less than 2 seconds.  Neurological:     General: No focal deficit present.     Mental Status: Erin Montgomery "KJ" is alert and oriented to person, place, and time.  Psychiatric:        Mood and Affect: Mood normal.        Behavior: Behavior normal.        Thought Content: Thought content normal.        Judgment: Judgment normal.   Today's Vitals   07/26/20 1111  BP: 122/77  Pulse: 88  Temp: (!) 97.5 F (36.4 C)  SpO2: 99%  Weight: 227 lb 6.4 oz (103.1 kg)  Height: 5' 3" (1.6 m)   Body mass index is 40.28 kg/m.   Wt Readings from Last 3 Encounters:  07/26/20 227 lb 6.4 oz (103.1 kg)  06/28/20 226 lb 1.6 oz (102.6 kg)  06/06/20 229 lb 4.8 oz (104 kg)     Health Maintenance Due  Topic Date Due   HPV VACCINES (1 - 2-dose series) Never done   HIV Screening  Never done   Hepatitis C Screening  Never done   TETANUS/TDAP  Never done   COVID-19 Vaccine (2 - Moderna series) 06/27/2020       Topic Date Due   HPV VACCINES (1 - 2-dose series) Never done    No results found for: TSH Lab Results  Component Value Date   WBC 8.0 06/07/2020   HGB 14.8 06/07/2020   HCT 43.6 06/07/2020   MCV 88 06/07/2020   PLT 293 06/07/2020   Lab Results  Component Value Date   NA 141 06/07/2020   K  4.4 06/07/2020   CO2 22 06/07/2020   GLUCOSE 97 06/07/2020   BUN 15 06/07/2020   CREATININE 0.68 06/07/2020   BILITOT 0.3 06/07/2020   ALKPHOS 88 06/07/2020   AST 18 06/07/2020   ALT 32 06/07/2020   PROT 7.0 06/07/2020   ALBUMIN 4.6 06/07/2020   CALCIUM 10.1 06/07/2020  EGFR 128 06/07/2020   Lab Results  Component Value Date   CHOL 162 06/07/2020   Lab Results  Component Value Date   HDL 41 06/07/2020   Lab Results  Component Value Date   LDLCALC 95 06/07/2020   Lab Results  Component Value Date   TRIG 147 06/07/2020   Lab Results  Component Value Date   CHOLHDL 4.0 06/07/2020   No results found for: HGBA1C    Assessment & Plan:  1. Elevated prolactin level MRI of the brain done due to elevated prolactin level on recent labs.  No acute or worrisome abnormalities were seen.  Will monitor.  2. Lazy eye of left side Referral to ophthalmology made at most recent visit.  3. Encounter for tubal ligation counseling Will refer patient to GYN for consultation for tubal ligation surgery. - Ambulatory referral to Gynecology  4. Factor V Leiden [place]) Patient now seeing hematology.  Problem List Items Addressed This Visit       Hematopoietic and Hemostatic   Factor V Leiden ([place])     Other   Elevated prolactin level - Primary   Lazy eye of left side   Encounter for tubal ligation counseling   Relevant Orders   Ambulatory referral to Gynecology     Follow-up: Return in about 6 months (around 01/26/2021) for mood - can we check opthalmology referral made at most recent visit. thanks. Marland Kitchen    Ronnell Freshwater, NP

## 2020-07-29 DIAGNOSIS — Z1379 Encounter for other screening for genetic and chromosomal anomalies: Secondary | ICD-10-CM | POA: Insufficient documentation

## 2020-07-29 DIAGNOSIS — Z308 Encounter for other contraceptive management: Secondary | ICD-10-CM | POA: Insufficient documentation

## 2020-08-01 ENCOUNTER — Encounter: Payer: Self-pay | Admitting: Nurse Practitioner

## 2020-08-01 NOTE — Telephone Encounter (Signed)
Hey. I referred her to ophthalmology for laxy eye and general eye care on 06/28/2020 and she has not received any information about the initial patient visit. Can you check on this for me? Thanks.

## 2020-08-02 NOTE — Telephone Encounter (Signed)
Called pt no answer LVM for pt to call the office

## 2020-08-07 NOTE — Telephone Encounter (Signed)
Called patient no answer.

## 2020-08-08 NOTE — Telephone Encounter (Signed)
Spoke to pt she is advised and agreeable about finding a general eye doctor

## 2020-09-23 ENCOUNTER — Telehealth: Payer: Self-pay | Admitting: Nurse Practitioner

## 2020-09-23 ENCOUNTER — Other Ambulatory Visit (HOSPITAL_COMMUNITY): Payer: Self-pay

## 2020-09-23 ENCOUNTER — Other Ambulatory Visit: Payer: Self-pay | Admitting: Nurse Practitioner

## 2020-09-23 DIAGNOSIS — D6851 Activated protein C resistance: Secondary | ICD-10-CM

## 2020-09-23 MED ORDER — APIXABAN 2.5 MG PO TABS
2.5000 mg | ORAL_TABLET | Freq: Two times a day (BID) | ORAL | 0 refills | Status: DC
  Filled 2020-09-23: qty 60, 30d supply, fill #0

## 2020-09-23 MED ORDER — APIXABAN 2.5 MG PO TABS: 2.5000 mg | ORAL_TABLET | Freq: Two times a day (BID) | ORAL | 0 refills | Status: DC

## 2020-09-23 NOTE — Telephone Encounter (Signed)
Medication refilled. AS, CMA

## 2020-09-23 NOTE — Telephone Encounter (Signed)
Set prescription for eliquis to [facility] electronically.

## 2020-09-23 NOTE — Telephone Encounter (Signed)
Patient would like her Eli[facility] to [facility] [phone] phone number, [phone] is the fax number. Thanks

## 2020-09-23 NOTE — Progress Notes (Signed)
Set prescription for eliquis to [facility] electronically.

## 2020-10-04 ENCOUNTER — Telehealth: Payer: Self-pay | Admitting: Nurse Practitioner

## 2020-10-04 ENCOUNTER — Other Ambulatory Visit: Payer: Self-pay | Admitting: Nurse Practitioner

## 2020-10-04 ENCOUNTER — Other Ambulatory Visit (HOSPITAL_COMMUNITY): Payer: Self-pay

## 2020-10-04 DIAGNOSIS — D6851 Activated protein C resistance: Secondary | ICD-10-CM

## 2020-10-04 MED ORDER — APIXABAN 2.5 MG PO TABS
2.5000 mg | ORAL_TABLET | Freq: Two times a day (BID) | ORAL | 1 refills | Status: DC
  Filled 2020-10-04: qty 180, 90d supply, fill #0
  Filled 2021-01-21: qty 180, 90d supply, fill #1

## 2020-10-04 NOTE — Telephone Encounter (Signed)
Patient is having a problem getting her Eliquis from mail order pharmacy.  She has been trying to get her medication from them for the last 3 weeks and has been unsuccessful.  She needs the medication resent to [facility].  If there are any issues, please call her back.    apixaban (ELIQUIS) 2.5 MG TABS tablet [id number]    Order Details Dose: 2.5 mg Route: Oral Frequency: 2 times daily  Dispense Quantity: 60 tablet Refills: 0   Indications of Use: Deep Vein Thrombosis       Sig: Take 1 tablet (2.5 mg total) by mouth 2 (two) times daily.       Start Date: 09/23/20 End Date: --  Written Date: 09/23/20 Expiration Date: 09/23/21     Diagnosis Association: Factor V Leiden ([place]) (D68.51)  Original Order:  apixaban (ELIQUIS) 2.5 MG TABS tablet [id number]

## 2020-10-04 NOTE — Telephone Encounter (Signed)
Called pt left message stating that her Rx was sent to the right pharmacy

## 2020-10-04 NOTE — Telephone Encounter (Signed)
Hey. I have sent this to [facility] just now. I did send the prescription to [facility] on 09/23/2020. I''m not sure where that part broke down. New one sent to [facility] for #[id number] with 1 refill 10/04/2020.

## 2020-10-04 NOTE — Progress Notes (Signed)
Sent eliquis prescription to [facility] 10/04/2020

## 2021-01-21 ENCOUNTER — Other Ambulatory Visit (HOSPITAL_COMMUNITY): Payer: Self-pay

## 2021-01-22 ENCOUNTER — Other Ambulatory Visit (HOSPITAL_COMMUNITY): Payer: Self-pay

## 2021-01-28 ENCOUNTER — Ambulatory Visit (INDEPENDENT_AMBULATORY_CARE_PROVIDER_SITE_OTHER): Payer: 59 | Admitting: Nurse Practitioner

## 2021-01-28 ENCOUNTER — Encounter: Payer: Self-pay | Admitting: Nurse Practitioner

## 2021-01-28 ENCOUNTER — Other Ambulatory Visit: Payer: Self-pay

## 2021-01-28 VITALS — BP 95/65 | HR 98 | Temp 98.6°F | Ht 63.0 in | Wt 225.7 lb

## 2021-01-28 DIAGNOSIS — F411 Generalized anxiety disorder: Secondary | ICD-10-CM | POA: Diagnosis not present

## 2021-01-28 DIAGNOSIS — Z6839 Body mass index (BMI) 39.0-39.9, adult: Secondary | ICD-10-CM

## 2021-01-28 DIAGNOSIS — H53002 Unspecified amblyopia, left eye: Secondary | ICD-10-CM

## 2021-01-28 DIAGNOSIS — D6851 Activated protein C resistance: Secondary | ICD-10-CM | POA: Diagnosis not present

## 2021-01-28 NOTE — Patient Instructions (Addendum)
Fat and Cholesterol Restricted Eating Plan Getting too much fat and cholesterol in your diet may cause health problems. Choosing the right foods helps keep your fat and cholesterol at normal levels. This can keep you from getting certain diseases. Your doctor may recommend an eating plan that includes: Total fat: ______% or less of total calories a day. This is ______g of fat a day. Saturated fat: ______% or less of total calories a day. This is ______g of saturated fat a day. Cholesterol: less than _________mg a day. Fiber: ______g a day. What are tips for following this plan? General tips Work with your doctor to lose weight if you need to. Avoid: Foods with added sugar. Fried foods. Foods with trans fat or partially hydrogenated oils. This includes some margarines and baked goods. If you drink alcohol: Limit how much you have to: 0-1 drink a day for women who are not pregnant. 0-2 drinks a day for men. Know how much alcohol is in a drink. In the [place] one drink equals one 12 oz bottle of beer (355 mL), one 5 oz glass of wine (148 mL), or one 1 oz glass of hard liquor (44 mL). Reading food labels Check food labels for: Trans fats. Partially hydrogenated oils. Saturated fat (g) in each serving. Cholesterol (mg) in each serving. Fiber (g) in each serving. Choose foods with healthy fats, such as: Monounsaturated fats and polyunsaturated fats. These include olive and canola oil, flaxseeds, walnuts, almonds, and seeds. Omega-3 fats. These are found in certain fish, flaxseed oil, and ground flaxseeds. Choose grain products that have whole grains. Look for the word "whole" as the first word in the ingredient list. Cooking Cook foods using low-fat methods. These include baking, boiling, grilling, and broiling. Eat more home-cooked foods. Eat at restaurants and buffets less often. Eat less fast food. Avoid cooking using saturated fats, such as butter, cream, palm oil, palm kernel oil, and  coconut oil. Meal planning  At meals, divide your plate into four equal parts: Fill one-half of your plate with vegetables, green salads, and fruit. Fill one-fourth of your plate with whole grains. Fill one-fourth of your plate with low-fat (lean) protein foods. Eat fish that is high in omega-3 fats at least two times a week. This includes mackerel, tuna, sardines, and salmon. Eat foods that are high in fiber, such as whole grains, beans, apples, pears, berries, broccoli, carrots, peas, and barley. What foods should I eat? Fruits All fresh, canned (in natural juice), or frozen fruits. Vegetables Fresh or frozen vegetables (raw, steamed, roasted, or grilled). Green salads. Grains Whole grains, such as whole wheat or whole grain breads, crackers, cereals, and pasta. Unsweetened oatmeal, bulgur, barley, quinoa, or brown rice. Corn or whole wheat flour tortillas. Meats and other protein foods Ground beef (85% or leaner), grass-fed beef, or beef trimmed of fat. Skinless chicken or turkey. Ground chicken or turkey. Pork trimmed of fat. All fish and seafood. Egg whites. Dried beans, peas, or lentils. Unsalted nuts or seeds. Unsalted canned beans. Nut butters without added sugar or oil. Dairy Low-fat or nonfat dairy products, such as skim or 1% milk, 2% or reduced-fat cheeses, low-fat and fat-free ricotta or cottage cheese, or plain low-fat and nonfat yogurt. Fats and oils Tub margarine without trans fats. Light or reduced-fat mayonnaise and salad dressings. Avocado. Olive, canola, sesame, or safflower oils. The items listed above may not be a complete list of foods and beverages you can eat. Contact a dietitian for more information. What foods   should I avoid? Fruits Canned fruit in heavy syrup. Fruit in cream or butter sauce. Fried fruit. Vegetables Vegetables cooked in cheese, cream, or butter sauce. Fried vegetables. Grains White bread. White pasta. White rice. Cornbread. Bagels, pastries,  and croissants. Crackers and snack foods that contain trans fat and hydrogenated oils. Meats and other protein foods Fatty cuts of meat. Ribs, chicken wings, bacon, sausage, bologna, salami, chitterlings, fatback, hot dogs, bratwurst, and packaged lunch meats. Liver and organ meats. Whole eggs and egg yolks. Chicken and turkey with skin. Fried meat. Dairy Whole or 2% milk, cream, half-and-half, and cream cheese. Whole milk cheeses. Whole-fat or sweetened yogurt. Full-fat cheeses. Nondairy creamers and whipped toppings. Processed cheese, cheese spreads, and cheese curds. Fats and oils Butter, stick margarine, lard, shortening, ghee, or bacon fat. Coconut, palm kernel, and palm oils. Beverages Alcohol. Sugar-sweetened drinks such as sodas, lemonade, and fruit drinks. Sweets and desserts Corn syrup, sugars, honey, and molasses. Candy. Jam and jelly. Syrup. Sweetened cereals. Cookies, pies, cakes, donuts, muffins, and ice cream. The items listed above may not be a complete list of foods and beverages you should avoid. Contact a dietitian for more information. Summary Choosing the right foods helps keep your fat and cholesterol at normal levels. This can keep you from getting certain diseases. At meals, fill one-half of your plate with vegetables, green salads, and fruits. Eat high fiber foods, like whole grains, beans, apples, pears, berries, carrots, peas, and barley. Limit added sugar, saturated fats, alcohol, and fried foods. This information is not intended to replace advice given to you by your health care provider. Make sure you discuss any questions you have with your health care provider. Document Revised: 05/24/2020 Document Reviewed: 05/24/2020 Elsevier Patient Education  2022 Elsevier Inc.  Fat and Cholesterol Restricted Eating Plan Getting too much fat and cholesterol in your diet may cause health problems. Choosing the right foods helps keep your fat and cholesterol at normal levels.  This can keep you from getting certain diseases. Your doctor may recommend an eating plan that includes: Total fat: ______% or less of total calories a day. This is ______g of fat a day. Saturated fat: ______% or less of total calories a day. This is ______g of saturated fat a day. Cholesterol: less than _________mg a day. Fiber: ______g a day. What are tips for following this plan? General tips Work with your doctor to lose weight if you need to. Avoid: Foods with added sugar. Fried foods. Foods with trans fat or partially hydrogenated oils. This includes some margarines and baked goods. If you drink alcohol: Limit how much you have to: 0-1 drink a day for women who are not pregnant. 0-2 drinks a day for men. Know how much alcohol is in a drink. In the [place] one drink equals one 12 oz bottle of beer (355 mL), one 5 oz glass of wine (148 mL), or one 1 oz glass of hard liquor (44 mL). Reading food labels Check food labels for: Trans fats. Partially hydrogenated oils. Saturated fat (g) in each serving. Cholesterol (mg) in each serving. Fiber (g) in each serving. Choose foods with healthy fats, such as: Monounsaturated fats and polyunsaturated fats. These include olive and canola oil, flaxseeds, walnuts, almonds, and seeds. Omega-3 fats. These are found in certain fish, flaxseed oil, and ground flaxseeds. Choose grain products that have whole grains. Look for the word "whole" as the first word in the ingredient list. Cooking Cook foods using low-fat methods. These include baking, boiling, grilling,   and broiling. Eat more home-cooked foods. Eat at restaurants and buffets less often. Eat less fast food. Avoid cooking using saturated fats, such as butter, cream, palm oil, palm kernel oil, and coconut oil. Meal planning  At meals, divide your plate into four equal parts: Fill one-half of your plate with vegetables, green salads, and fruit. Fill one-fourth of your plate with whole  grains. Fill one-fourth of your plate with low-fat (lean) protein foods. Eat fish that is high in omega-3 fats at least two times a week. This includes mackerel, tuna, sardines, and salmon. Eat foods that are high in fiber, such as whole grains, beans, apples, pears, berries, broccoli, carrots, peas, and barley. What foods should I eat? Fruits All fresh, canned (in natural juice), or frozen fruits. Vegetables Fresh or frozen vegetables (raw, steamed, roasted, or grilled). Green salads. Grains Whole grains, such as whole wheat or whole grain breads, crackers, cereals, and pasta. Unsweetened oatmeal, bulgur, barley, quinoa, or brown rice. Corn or whole wheat flour tortillas. Meats and other protein foods Ground beef (85% or leaner), grass-fed beef, or beef trimmed of fat. Skinless chicken or turkey. Ground chicken or turkey. Pork trimmed of fat. All fish and seafood. Egg whites. Dried beans, peas, or lentils. Unsalted nuts or seeds. Unsalted canned beans. Nut butters without added sugar or oil. Dairy Low-fat or nonfat dairy products, such as skim or 1% milk, 2% or reduced-fat cheeses, low-fat and fat-free ricotta or cottage cheese, or plain low-fat and nonfat yogurt. Fats and oils Tub margarine without trans fats. Light or reduced-fat mayonnaise and salad dressings. Avocado. Olive, canola, sesame, or safflower oils. The items listed above may not be a complete list of foods and beverages you can eat. Contact a dietitian for more information. What foods should I avoid? Fruits Canned fruit in heavy syrup. Fruit in cream or butter sauce. Fried fruit. Vegetables Vegetables cooked in cheese, cream, or butter sauce. Fried vegetables. Grains White bread. White pasta. White rice. Cornbread. Bagels, pastries, and croissants. Crackers and snack foods that contain trans fat and hydrogenated oils. Meats and other protein foods Fatty cuts of meat. Ribs, chicken wings, bacon, sausage, bologna, salami,  chitterlings, fatback, hot dogs, bratwurst, and packaged lunch meats. Liver and organ meats. Whole eggs and egg yolks. Chicken and turkey with skin. Fried meat. Dairy Whole or 2% milk, cream, half-and-half, and cream cheese. Whole milk cheeses. Whole-fat or sweetened yogurt. Full-fat cheeses. Nondairy creamers and whipped toppings. Processed cheese, cheese spreads, and cheese curds. Fats and oils Butter, stick margarine, lard, shortening, ghee, or bacon fat. Coconut, palm kernel, and palm oils. Beverages Alcohol. Sugar-sweetened drinks such as sodas, lemonade, and fruit drinks. Sweets and desserts Corn syrup, sugars, honey, and molasses. Candy. Jam and jelly. Syrup. Sweetened cereals. Cookies, pies, cakes, donuts, muffins, and ice cream. The items listed above may not be a complete list of foods and beverages you should avoid. Contact a dietitian for more information. Summary Choosing the right foods helps keep your fat and cholesterol at normal levels. This can keep you from getting certain diseases. At meals, fill one-half of your plate with vegetables, green salads, and fruits. Eat high fiber foods, like whole grains, beans, apples, pears, berries, carrots, peas, and barley. Limit added sugar, saturated fats, alcohol, and fried foods. This information is not intended to replace advice given to you by your health care provider. Make sure you discuss any questions you have with your health care provider. Document Revised: 05/24/2020 Document Reviewed: 05/24/2020 Elsevier Patient Education    2022 Elsevier Inc.  

## 2021-01-28 NOTE — Progress Notes (Signed)
Established Patient Office Visit  Subjective:  Patient ID: Erin Montgomery, adult    DOB: [birth year]  Age: 21 y.o. MRN: [id number]  CC:  Chief Complaint  Patient presents with   Follow-up    HPI Erin Montgomery presents for follow up visit.  -she does need to have a new referral to ophthalmology. 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 Needs to have checked per ophthalmology once a year for surveillance. she did have a "virtual" consultation with ophthalmology. This was really did not work well and she would like to see a different provider. She would like to wait until new insurance starts before referral is done.  Continues to have joint pain in multiple areas. Specifically, the hips, knees, ankles, and sometimes her shoulders are bothering her. She states that mobility is becoming an issue. We have checked connective tissue panel in the recent past which was negative. We have discused referral to orthopedics for further evaluation and treatment. She would like to wait for new insurance before getting x-rays or being referred to ortho.  She states that her anxiety and depression have been much better. She managed her class schedule and work schedule better. She is not taking anything for anxiety/depression. She is feeling less dizzy. Not having dizzy spells when standing any longer.   Past Medical History:  Diagnosis Date   Anxiety    Clotting disorder ([place])    Factor V Leiden   Depression     History reviewed. No pertinent surgical history.  Family History  Problem Relation Age of Onset   Alcoholism Mother    Heart attack Mother    Depression Mother    High blood pressure Mother    High Cholesterol Mother    High blood pressure Father    High Cholesterol Father    Heart attack Maternal Grandmother    Heart attack Maternal Grandfather    Depression Maternal Grandfather    Alcoholism Paternal Grandmother     Social History    Socioeconomic History   Marital status: Single    Spouse name: Not on file   Number of children: Not on file   Years of education: Not on file   Highest education level: Not on file  Occupational History   Not on file  Tobacco Use   Smoking status: Never   Smokeless tobacco: Never  Substance and Sexual Activity   Alcohol use: Never   Drug use: Never   Sexual activity: Not Currently  Other Topics Concern   Not on file  Social History Narrative   Not on file   Social Determinants of Health   Financial Resource Strain: Not on file  Food Insecurity: Not on file  Transportation Needs: Not on file  Physical Activity: Not on file  Stress: Not on file  Social Connections: Not on file  Intimate Partner Violence: Not on file    Outpatient Medications Prior to Visit  Medication Sig Dispense Refill   apixaban (ELIQUIS) 2.5 MG TABS tablet Take 1 tablet (2.5 mg total) by mouth 2 (two) times daily. 180 tablet 1   COVID-19 mRNA vaccine, Moderna, (MODERNA COVID-19 VACCINE) 100 MCG/0.5ML injection Inject into the muscle. (Patient not taking: Reported on 01/28/2021) 0.25 mL 0   PFIZER COVID-19 VAC BIVALENT injection      No facility-administered medications prior to visit.    No Known Allergies  ROS Review of Systems  Constitutional:  Positive for activity  change and fatigue. Negative for chills and fever.  HENT:  Negative for congestion, postnasal drip, rhinorrhea, sinus pressure, sinus pain, sneezing and sore throat.   Eyes: Negative.   Respiratory:  Negative for cough, shortness of breath and wheezing.   Cardiovascular:  Negative for chest pain and palpitations.  Gastrointestinal:  Negative for constipation, diarrhea, nausea and vomiting.  Endocrine: Negative for cold intolerance, heat intolerance, polydipsia and polyuria.  Genitourinary:  Negative for dysuria, frequency and urgency.  Musculoskeletal:  Positive for arthralgias and myalgias. Negative for back pain.  Skin:   Negative for rash.  Allergic/Immunologic: Negative for environmental allergies.  Neurological:  Negative for dizziness, weakness and headaches.  Psychiatric/Behavioral:  The patient is nervous/anxious.      Objective:    Physical Exam Vitals and nursing note reviewed.  Constitutional:      Appearance: Normal appearance. BELLANIE MATTHEW "KJ" is well-developed. KEMONI QUESENBERRY "KJ" is obese.  HENT:     Head: Normocephalic and atraumatic.     Nose: Nose normal.     Mouth/Throat:     Mouth: Mucous membranes are moist.  Eyes:     Extraocular Movements: Extraocular movements intact.     Conjunctiva/sclera: Conjunctivae normal.     Pupils: Pupils are equal, round, and reactive to light.  Cardiovascular:     Rate and Rhythm: Normal rate and regular rhythm.     Pulses: Normal pulses.     Heart sounds: Normal heart sounds.  Pulmonary:     Effort: Pulmonary effort is normal.     Breath sounds: Normal breath sounds.  Abdominal:     Palpations: Abdomen is soft.  Musculoskeletal:        General: Normal range of motion.     Cervical back: Normal range of motion and neck supple.  Lymphadenopathy:     Cervical: No cervical adenopathy.  Skin:    General: Skin is warm and dry.     Capillary Refill: Capillary refill takes less than 2 seconds.  Neurological:     General: No focal deficit present.     Mental Status: Erin Montgomery "KJ" is alert and oriented to person, place, and time.  Psychiatric:        Mood and Affect: Mood normal.        Behavior: Behavior normal.        Thought Content: Thought content normal.        Judgment: Judgment normal.    Today's Vitals   01/28/21 1548  BP: 95/65  Pulse: 98  Temp: 98.6 F (37 C)  SpO2: 98%  Weight: 225 lb 11.2 oz (102.4 kg)  Height: 5' 3" (1.6 m)   Body mass index is 39.98 kg/m.   Wt Readings from Last 3 Encounters:  01/28/21 225 lb 11.2 oz (102.4 kg)  07/26/20 227 lb 6.4 oz (103.1 kg)  06/28/20 226 lb 1.6 oz (102.6 kg)      Health Maintenance Due  Topic Date Due   HPV VACCINES (1 - 2-dose series) Never done   HIV Screening  Never done   Hepatitis C Screening  Never done   TETANUS/TDAP  Never done   COVID-19 Vaccine (2 - Pfizer series) 11/04/2020       Topic Date Due   HPV VACCINES (1 - 2-dose series) Never done    No results found for: TSH Lab Results  Component Value Date   WBC 8.0 06/07/2020   HGB 14.8 06/07/2020   HCT 43.6 06/07/2020   MCV  88 06/07/2020   PLT 293 06/07/2020   Lab Results  Component Value Date   NA 141 06/07/2020   K 4.4 06/07/2020   CO2 22 06/07/2020   GLUCOSE 97 06/07/2020   BUN 15 06/07/2020   CREATININE 0.68 06/07/2020   BILITOT 0.3 06/07/2020   ALKPHOS 88 06/07/2020   AST 18 06/07/2020   ALT 32 06/07/2020   PROT 7.0 06/07/2020   ALBUMIN 4.6 06/07/2020   CALCIUM 10.1 06/07/2020   EGFR 128 06/07/2020   Lab Results  Component Value Date   CHOL 162 06/07/2020   Lab Results  Component Value Date   HDL 41 06/07/2020   Lab Results  Component Value Date   LDLCALC 95 06/07/2020   Lab Results  Component Value Date   TRIG 147 06/07/2020   Lab Results  Component Value Date   CHOLHDL 4.0 06/07/2020   No results found for: HGBA1C    Assessment & Plan:  1. Body mass index (BMI) of 39.0-39.9 in adult Discussed lowering calorie intake to 1500 calories per day and incorporating exercise into daily routine to help lose weight.   2. Lazy eye of left side Will get referral to new ophthalmologist after new [employer] kicks in.  3. Factor V Leiden [facility]) Patient to continue Eliquis as prescribed.  4. Generalized anxiety disorder Currently well managed without medications.  We will continue to monitor closely.  Problem List Items Addressed This Visit       Hematopoietic and Hemostatic   Factor V Leiden ([place])     Other   Generalized anxiety disorder   Lazy eye of left side   Body mass index (BMI) of 39.0-39.9 in adult - Primary      Follow-up: Return in about 6 months (around 07/28/2021) for health maintenance exam, FBW a week prior to visit, add free T$, vitamin d.    Ronnell Freshwater, NP

## 2021-02-02 DIAGNOSIS — Z6839 Body mass index (BMI) 39.0-39.9, adult: Secondary | ICD-10-CM | POA: Insufficient documentation

## 2021-03-24 ENCOUNTER — Telehealth: Payer: Self-pay | Admitting: Nurse Practitioner

## 2021-03-24 ENCOUNTER — Telehealth: Payer: Self-pay | Admitting: Oncology

## 2021-03-24 ENCOUNTER — Other Ambulatory Visit: Payer: Self-pay | Admitting: Nurse Practitioner

## 2021-03-24 DIAGNOSIS — Z86718 Personal history of other venous thrombosis and embolism: Secondary | ICD-10-CM

## 2021-03-24 DIAGNOSIS — Z308 Encounter for other contraceptive management: Secondary | ICD-10-CM

## 2021-03-24 DIAGNOSIS — F411 Generalized anxiety disorder: Secondary | ICD-10-CM

## 2021-03-24 DIAGNOSIS — F909 Attention-deficit hyperactivity disorder, unspecified type: Secondary | ICD-10-CM

## 2021-03-24 DIAGNOSIS — Z9151 Personal history of suicidal behavior: Secondary | ICD-10-CM

## 2021-03-24 DIAGNOSIS — H53002 Unspecified amblyopia, left eye: Secondary | ICD-10-CM

## 2021-03-24 DIAGNOSIS — D6851 Activated protein C resistance: Secondary | ICD-10-CM

## 2021-03-24 DIAGNOSIS — N912 Amenorrhea, unspecified: Secondary | ICD-10-CM

## 2021-03-24 MED ORDER — APIXABAN 2.5 MG PO TABS: 2.5000 mg | ORAL_TABLET | Freq: Two times a day (BID) | ORAL | 1 refills | Status: DC

## 2021-03-24 NOTE — Telephone Encounter (Signed)
Left patient detailed message.

## 2021-03-24 NOTE — Progress Notes (Signed)
Referrals placed again to psychiatry, GYN, ophthalmology, and hematology. I have also sent eliquis to [facility].

## 2021-03-24 NOTE — Telephone Encounter (Signed)
Left a voicemail for patient in regards of location preference, referral sent from [facility]. Patient is closer to [facility] , so we advise that [facility] is closer or if she would prefer to be seen here. Advised patient to call back.

## 2021-03-24 NOTE — Telephone Encounter (Signed)
[place] referrals placed again to psychiatry, GYN, ophthalmology, and hematology. I have also sent eliquis to [place].

## 2021-03-24 NOTE — Telephone Encounter (Signed)
Patient just got insurance and said to please send out all the referrals that were placed. Also requesting refill of Eliquis-send to [facility] [address]. [phone]

## 2021-03-25 ENCOUNTER — Telehealth: Payer: Self-pay | Admitting: Hematology and Oncology

## 2021-03-25 NOTE — Telephone Encounter (Signed)
Scheduled appt per 2/27 referral. Pt is aware of appt date and time. Pt is aware to arrive 15 mins prior to appt time and to bring and updated insurance card. Pt is aware of appt location.   °

## 2021-04-07 ENCOUNTER — Other Ambulatory Visit (HOSPITAL_COMMUNITY)
Admission: RE | Admit: 2021-04-07 | Discharge: 2021-04-07 | Disposition: A | Discharge status: Home / Self Care | Payer: 59 | Source: Ambulatory Visit | Attending: Obstetrics and Gynecology | Admitting: Obstetrics and Gynecology

## 2021-04-07 ENCOUNTER — Other Ambulatory Visit: Payer: Self-pay

## 2021-04-07 ENCOUNTER — Ambulatory Visit: Payer: Commercial Managed Care - PPO | Admitting: Obstetrics and Gynecology

## 2021-04-07 ENCOUNTER — Encounter: Payer: Self-pay | Admitting: Obstetrics and Gynecology

## 2021-04-07 VITALS — BP 122/80 | HR 79 | Ht 63.0 in | Wt 227.0 lb

## 2021-04-07 DIAGNOSIS — Z124 Encounter for screening for malignant neoplasm of cervix: Secondary | ICD-10-CM

## 2021-04-07 DIAGNOSIS — Z119 Encounter for screening for infectious and parasitic diseases, unspecified: Secondary | ICD-10-CM

## 2021-04-07 DIAGNOSIS — Z113 Encounter for screening for infections with a predominantly sexual mode of transmission: Secondary | ICD-10-CM

## 2021-04-07 DIAGNOSIS — Z23 Encounter for immunization: Secondary | ICD-10-CM

## 2021-04-07 DIAGNOSIS — R7989 Other specified abnormal findings of blood chemistry: Secondary | ICD-10-CM | POA: Diagnosis not present

## 2021-04-07 DIAGNOSIS — Z01419 Encounter for gynecological examination (general) (routine) without abnormal findings: Secondary | ICD-10-CM

## 2021-04-07 NOTE — Progress Notes (Unsigned)
21 y.o. G0P0000 Single Ashkenazi Jewish female here for annual exam.    Periods are irregular, heavy and painful.  Menses range from 4 - 12 weeks.  Periods last 5 - 7 days.  Bleeds through a super or super plus about 2 hours.  Uses a heating blanket for dysmenorrhea.   States unable to take hormonal birth control due to Factor V Leiden, dx when she was 21 yo.  No confirmed blood clot but has had episodes of arm or leg swelling.  Takes Eliquis since she had a leg fracture.   Has elevated prolactin 46.5 on 06/07/20. Had MRI of brain which showed normal pituitary.   FSH 21.6 and estradiol 420 on 06/07/20.  Is a stroke broker.   PCP:  Leretha Pol, NP  Patient's last menstrual period was 04/06/2021 (exact date).     Period Pattern: (!) Irregular Menstrual Flow: Heavy Menstrual Control: Tampon (uses menstrual cup occ. also) Menstrual Control Change Freq (Hours): changes super plus tampon every 1.5 hours on heaviest day Dysmenorrhea: (!) Moderate Dysmenorrhea Symptoms: Cramping, Nausea, Diarrhea, Headache     Sexually active: Yes.     Partner is transgender female, assigned female at birth, nonbinary. The current method of family planning is Trans-masc non binary.    Exercising: Yes.     Plays rugby Smoker:  no  Health Maintenance: Pap:  never History of abnormal Pap:  n/a MMG:  n/a Colonoscopy:  n/a BMD:   n/a  Result  n/a TDaP: out of date Gardasil:   yes, completed HIV: neg in the past Hep C: neg in the past Screening Labs:  PCP   reports that [employer] Searles "Cherylann Banas" has never smoked. [place]. Warnell "KJ" has never used smokeless tobacco. [place]. Robles "KJ" reports current alcohol use. [place]. Blinder "KJ" reports that [employer]. Dimmer "KJ" does not use drugs.  Past Medical History:  Diagnosis Date   Anxiety    Clotting disorder ([place])    Factor V Leiden   Depression    Dysmenorrhea     Past Surgical History:  Procedure Laterality Date   broken ankle Right    EYE  SURGERY     x5    Current Outpatient Medications  Medication Sig Dispense Refill   apixaban (ELIQUIS) 2.5 MG TABS tablet Take 1 tablet (2.5 mg total) by mouth 2 (two) times daily. 180 tablet 1   No current facility-administered medications for this visit.    Family History  Problem Relation Age of Onset   Hypertension Mother    Alcoholism Mother    Heart attack Mother    Depression Mother    High blood pressure Mother    High Cholesterol Mother    High blood pressure Father    High Cholesterol Father    Diabetes Maternal Aunt    Cancer Maternal Grandmother        colon cancer   Heart attack Maternal Grandmother    Heart attack Maternal Grandfather    Depression Maternal Grandfather    Ovarian cancer Paternal Grandmother    Breast cancer Paternal Grandmother    Alcoholism Paternal Grandmother     Review of Systems  Genitourinary:  Positive for menstrual problem (heavy/irregular cycles).  All other systems reviewed and are negative.  Exam:   BP 122/80    Pulse 79    Ht '5\' 3"'$  (1.6 m)    Wt 227 lb (103 kg)    LMP 04/06/2021 (Exact Date)    SpO2 98%  BMI 40.21 kg/m     General appearance: alert, cooperative and appears stated age Head: normocephalic, without obvious abnormality, atraumatic Neck: no adenopathy, supple, symmetrical, trachea midline and thyroid normal to inspection and palpation Lungs: clear to auscultation bilaterally Breasts: normal appearance, no masses or tenderness, No nipple retraction or dimpling, No nipple discharge or bleeding, No axillary adenopathy Heart: regular rate and rhythm Abdomen: soft, non-tender; no masses, no organomegaly Extremities: extremities normal, atraumatic, no cyanosis or edema Skin: skin color, texture, turgor normal. No rashes or lesions Lymph nodes: cervical, supraclavicular, and axillary nodes normal. Neurologic: grossly normal  Pelvic: External genitalia:  no lesions              No abnormal inguinal nodes palpated.               Urethra:  normal appearing urethra with no masses, tenderness or lesions              Bartholins and Skenes: normal                 Vagina: normal appearing vagina with normal color and discharge, no lesions              Cervix: no lesions.  Mild menstrual flow noted.              Pap taken: yes Bimanual Exam:  Uterus:  normal size, contour, position, consistency, mobility, non-tender              Adnexa: no mass, fullness, tenderness            Chaperone was present for exam:  Estill Bamberg, CMA  Assessment:   Well woman visit with gynecologic exam. Factor V Leiden. Irregular menses.  Dysmenorrhea. Elevated prolactin.  Ashkenazi Jewish background.   Plan: Mammogram screening age 38.  Self breast awareness reviewed. Pap and reflex HR HPV Guidelines for Calcium, Vitamin D, regular exercise program including cardiovascular and weight bearing exercise. STD screening.  Check prolactin level.  We discussed progesterone IUDs.  She wants to do her research about this.  Follow up annually and prn.   After visit summary provided.

## 2021-04-07 NOTE — Patient Instructions (Signed)
EXERCISE AND DIET:  We recommended that you start or continue a regular exercise program for good health. Regular exercise means any activity that makes your heart beat faster and makes you sweat.  We recommend exercising at least 30 minutes per day at least 3 days a week, preferably 4 or 5.  We also recommend a diet low in fat and sugar.  Inactivity, poor dietary choices and obesity can cause diabetes, heart attack, stroke, and kidney damage, among others.   ° °ALCOHOL AND SMOKING:  Women should limit their alcohol intake to no more than 7 drinks/beers/glasses of wine (combined, not each!) per week. Moderation of alcohol intake to this level decreases your risk of breast cancer and liver damage. And of course, no recreational drugs are part of a healthy lifestyle.  And absolutely no smoking or even second hand smoke. Most people know smoking can cause heart and lung diseases, but did you know it also contributes to weakening of your bones? Aging of your skin?  Yellowing of your teeth and nails? ° °CALCIUM AND VITAMIN D:  Adequate intake of calcium and Vitamin D are recommended.  The recommendations for exact amounts of these supplements seem to change often, but generally speaking 600 mg of calcium (either carbonate or citrate) and 800 units of Vitamin D per day seems prudent. Certain women may benefit from higher intake of Vitamin D.  If you are among these women, your doctor will have told you during your visit.   ° °PAP SMEARS:  Pap smears, to check for cervical cancer or precancers,  have traditionally been done yearly, although recent scientific advances have shown that most women can have pap smears less often.  However, every woman still should have a physical exam from her gynecologist every year. It will include a breast check, inspection of the vulva and vagina to check for abnormal growths or skin changes, a visual exam of the cervix, and then an exam to evaluate the size and shape of the uterus and  ovaries.  And after 21 years of age, a rectal exam is indicated to check for rectal cancers. We will also provide age appropriate advice regarding health maintenance, like when you should have certain vaccines, screening for sexually transmitted diseases, bone density testing, colonoscopy, mammograms, etc.  ° °MAMMOGRAMS:  All women over 40 years old should have a yearly mammogram. Many facilities now offer a "3D" mammogram, which may cost around $50 extra out of pocket. If possible,  we recommend you accept the option to have the 3D mammogram performed.  It both reduces the number of women who will be called back for extra views which then turn out to be normal, and it is better than the routine mammogram at detecting truly abnormal areas.   ° °COLONOSCOPY:  Colonoscopy to screen for colon cancer is recommended for all women at age 50.  We know, you hate the idea of the prep.  We agree, BUT, having colon cancer and not knowing it is worse!!  Colon cancer so often starts as a polyp that can be seen and removed at colonscopy, which can quite literally save your life!  And if your first colonoscopy is normal and you have no family history of colon cancer, most women don't have to have it again for 10 years.  Once every ten years, you can do something that may end up saving your life, right?  We will be happy to help you get it scheduled when you are ready.    Be sure to check your insurance coverage so you understand how much it will cost.  It may be covered as a preventative service at no cost, but you should check your particular policy.    Intrauterine Device Information An intrauterine device (IUD) is a medical device that is inserted into the uterus to prevent pregnancy. It is a small, T-shaped device that has one or two nylon strings hanging down from it. The strings hang out of the lower part of the uterus (cervix) to allow for future IUD removal. There are two types of IUDs: Hormone IUD. This type of IUD is  made of plastic and contains the hormone progestin (synthetic progesterone). A hormone IUD may last 3-5 years. Copper IUD. This type of IUD has copper wire wrapped around it. A copper IUD may last up to 10 years. How is an IUD inserted? An IUD is inserted through the vagina, through the cervix, and into the uterus with a minor medical procedure. The procedure for IUD insertion may vary among health care providers and hospitals. How does an IUD work? Synthetic progesterone in a hormonal IUD prevents pregnancy by: Thickening cervical mucus to prevent sperm from entering the uterus. Thinning the uterine lining to prevent a fertilized egg from being implanted there. Copper in a copper IUD prevents pregnancy by making the uterus and fallopian tubes produce a fluid that kills sperm. What are the advantages of an IUD? Advantages of either type of IUD An IUD: Is highly effective in preventing pregnancy. Is reversible. You can become pregnant shortly after the IUD is removed. Is low-maintenance and can stay in place for a long time. Has no estrogen-related side effects. Can be used when breastfeeding. Is not associated with weight gain. Can be inserted right after childbirth, an abortion, or a miscarriage. Advantages of a hormone IUD If it is inserted within 7 days of your period starting, it works right after it has been inserted. If the hormone IUD is inserted at any other time in your cycle, you will need to use a backup method of birth control for 7 days after insertion. It can make menstrual periods lighter or stop completely. It can reduce menstrual cramping and other discomforts from menstrual periods. It can be used for 3-5 years, depending on which IUD you have. Advantages of a copper IUD It works right after it is inserted. It can be used as a form of emergency birth control if it is inserted within 5 days after having unprotected sex. It does not interfere with your body's natural  hormones. It can be used for up to 10 years. What are the disadvantages of an IUD? An IUD may cause irregular menstrual bleeding for a period of time after insertion. It is common to have pain during insertion and have cramping and vaginal bleeding after insertion. An IUD may cut the uterus (uterine perforation) when it is inserted. This is rare. Pelvic inflammatory disease (PID) may happen after insertion of an IUD. PID is an infection in the uterus and fallopian tubes. The IUD does not cause the infection. The infection is usually from an unknown sexually transmitted infection (STI). This is rare, and it usually happens during the first 20 days after the IUD is inserted. A copper IUD can make your menstrual flow heavier and more painful. IUDs cannot prevent sexually transmitted infections (STIs). How is an IUD removed?  You will lie on your back with your knees bent and your feet in footrests (stirrups). A device will  be inserted into your vagina to spread apart the vaginal walls (speculum). This will allow your health care provider to see the strings attached to the IUD. Your health care provider will use a small instrument (forceps) to grasp the IUD strings and will pull firmly until the IUD is removed. You may have some discomfort when the IUD is removed. Your health care provider may recommend taking over-the-counter pain relievers, such as ibuprofen, before the procedure. You may also have minor spotting for a few days after the procedure. The procedure for IUD removal may vary among health care providers and hospitals. Is an IUD right for me? If you are interested in an IUD, discuss it with your health care provider. He or she will make sure you are a good candidate for an IUD and will let you know more about the advantages, disadvantage, and possible side effects. This will allow you to make a decision about the device. Summary An intrauterine device (IUD) is a medical device that is  inserted in the uterus to prevent pregnancy. It is a small, T-shaped device that has one or two nylon strings hanging down from it. A hormone IUD contains the hormone progestin (synthetic progesterone). A copper IUD has copper wire wrapped around it. Synthetic progesterone in a hormone IUD prevents pregnancy by thickening cervical mucus and thinning the walls of the uterus. Copper in a copper IUD prevents pregnancy by making the uterus and fallopian tubes produce a fluid that kills sperm. A hormone IUD can be left in place for 3-5 years. A copper IUD can be left in place for up to 10 years. An IUD is inserted and removed by a health care provider. You may feel some pain during insertion and removal. Your health care provider may recommend taking over-the-counter pain medicine, such as ibuprofen, before an IUD procedure. This information is not intended to replace advice given to you by your health care provider. Make sure you discuss any questions you have with your health care provider. Document Revised: 07/26/2019 Document Reviewed: 07/26/2019 Elsevier Patient Education  [place].

## 2021-04-08 LAB — HIV ANTIBODY (ROUTINE TESTING W REFLEX): HIV 1&2 Ab, 4th Generation: NONREACTIVE

## 2021-04-08 LAB — HEPATITIS C ANTIBODY
Hepatitis C Ab: NONREACTIVE
SIGNAL TO CUT-OFF: <0.02 (ref <1.00)

## 2021-04-08 LAB — PROLACTIN: Prolactin: 9.2 ng/mL

## 2021-04-08 LAB — RPR: RPR Ser Ql: NONREACTIVE

## 2021-04-10 ENCOUNTER — Encounter: Payer: Self-pay | Admitting: Obstetrics and Gynecology

## 2021-04-10 LAB — CYTOLOGY - PAP
Adequacy: ABNORMAL
Chlamydia: NEGATIVE
Comment: NEGATIVE
Comment: NEGATIVE
Comment: NORMAL
Neisseria Gonorrhea: NEGATIVE
Trichomonas: NEGATIVE

## 2021-05-20 ENCOUNTER — Telehealth: Payer: Self-pay | Admitting: *Deleted

## 2021-05-20 NOTE — Telephone Encounter (Signed)
Nunzio Cobbs, MD  ?04/12/2021  3:22 PM EDT   ?  ?Results to patient through My Chart. ?  ?Hi KJ,  ?  ?Your testing for infection is negative for gonorrhea, chlamydia and trichomonas.  ?  ?Please schedule your repeat pap at your convenience.  ?  ?Please contact the office for any questions.  ?  ?Have a good weekend! ?  ?Josefa Half, MD  ? ?

## 2021-05-20 NOTE — Telephone Encounter (Signed)
Call to patient. Advised needs to schedule appointment to recollect pap smear. Declines to schedule at this time, stating they are in the "throws" of getting their licensure. States would like to call back at the end of May to schedule appointment.  ?

## 2021-06-09 ENCOUNTER — Encounter: Payer: Self-pay | Admitting: Hematology and Oncology

## 2021-06-09 ENCOUNTER — Other Ambulatory Visit: Payer: Self-pay

## 2021-06-09 ENCOUNTER — Inpatient Hospital Stay: Payer: Commercial Managed Care - PPO | Attending: Hematology and Oncology | Admitting: Hematology and Oncology

## 2021-06-09 VITALS — BP 120/86 | HR 112 | Temp 99.0°F | Resp 18 | Ht 63.0 in | Wt 230.2 lb

## 2021-06-09 DIAGNOSIS — Z800 Family history of malignant neoplasm of digestive organs: Secondary | ICD-10-CM | POA: Diagnosis not present

## 2021-06-09 DIAGNOSIS — Z8041 Family history of malignant neoplasm of ovary: Secondary | ICD-10-CM | POA: Diagnosis not present

## 2021-06-09 DIAGNOSIS — Z803 Family history of malignant neoplasm of breast: Secondary | ICD-10-CM | POA: Diagnosis not present

## 2021-06-09 DIAGNOSIS — D6851 Activated protein C resistance: Secondary | ICD-10-CM | POA: Diagnosis not present

## 2021-06-09 NOTE — Progress Notes (Signed)
[name] ?CONSULT NOTE ? ?Patient Care Team: ?Ronnell Freshwater, NP as PCP - General (Family Medicine) ? ?CHIEF COMPLAINTS/PURPOSE OF CONSULTATION:  ?Homozygous for factor V mutation, unproven diagnosis of DVT ? ?HISTORY OF PRESENTING ILLNESS:  ?Erin Montgomery 21 y.o.  is here because of diagnosis of factor V Leiden mutation ?The patient stated repeatedly that they could not remember all the details related their medical history ?They stated that they were diagnosed with factor V Leiden mutation because the gynecologist refused to prescribe birth control pill given known family history of factor V Leiden mutation in their mother ?Genetic testing was carried out and they were told they were homozygous for factor V Leiden mutation. ?They were not prescribed birth control pill because of this ?They had 3 eye surgeries on the left and one eye surgery on the right without thrombotic complications ?They had an accident in the year of 2020 when they broke their right ankle.  They saw a hematologist in [place] and received treatment with Eliquis perioperatively ?They have been on Eliquis since then ?Between early 2021 until summer 2022, they cannot afford the prescription of Eliquis and had not been taking it consistently until after the summer 2022, when they were added to the mothers insurance plan, they were able to take Eliquis consistently but at a lower dose ?There have been concerns that the patient might have blood clot but were never diagnosed because of lack of insurance coverage ? ?Currently, they are active with exercise, playing rugby at least 2 times a week.  They admitted that they are not drinking enough fluids ?They have been thinking about getting testosterone injection but now have second thoughts about it ?They have intermittent menorrhagia/irregular menstrual cycles but denies excessive bleeding requiring transfusion support while on Eliquis ? ?Erin Montgomery "KJ" denies lower  extremity swelling, warmth, tenderness & erythema.  Erin Montgomery "KJ" denies recent chest pain on exertion, shortness of breath on minimal exertion, pre-syncopal episodes, hemoptysis, or palpitation. ?Erin Montgomery "KJ" denies smoking or prolonged immobilization. ?Erin Montgomery "KJ" had no prior history or diagnosis of cancer. Erin Montgomery "KJ"'s age appropriate screening programs are up-to-date. ?Erin Montgomery "KJ" had prior surgeries before and never had perioperative thromboembolic events. ? ?MEDICAL HISTORY:  ?Past Medical History:  ?Diagnosis Date  ? Anxiety   ? Clotting disorder ([place])   ? Factor V Leiden  ? Depression   ? Dysmenorrhea   ? ? ?SURGICAL HISTORY: ?Past Surgical History:  ?Procedure Laterality Date  ? broken ankle Right   ? EYE SURGERY    ? x5  ? ? ?SOCIAL HISTORY: ?Social History  ? ?Socioeconomic History  ? Marital status: Single  ?  Spouse name: Not on file  ? Number of children: Not on file  ? Years of education: Not on file  ? Highest education level: Not on file  ?Occupational History  ? Not on file  ?Tobacco Use  ? Smoking status: Never  ? Smokeless tobacco: Never  ?Vaping Use  ? Vaping Use: Never used  ?Substance and Sexual Activity  ? Alcohol use: Yes  ?  Comment: 2 drinks/month  ? Drug use: Never  ? Sexual activity: Yes  ?  Birth control/protection: None  ?  Comment: female partner  ?Other Topics Concern  ? Not on file  ?Social History Narrative  ? Not on file  ? ?Social Determinants of Health  ? ?Financial Resource Strain: Not on file  ?Food Insecurity:  Not on file  ?Transportation Needs: Not on file  ?Physical Activity: Not on file  ?Stress: Not on file  ?Social Connections: Not on file  ?Intimate Partner Violence: Not on file  ? ? ?FAMILY HISTORY: ?Family History  ?Problem Relation Age of Onset  ? Hypertension Mother   ? Alcoholism Mother   ? Heart attack Mother   ? Depression Mother   ? High blood pressure Mother   ? High Cholesterol Mother   ? High blood pressure Father   ? High  Cholesterol Father   ? Diabetes Maternal Aunt   ? Cancer Maternal Grandmother   ?     colon cancer  ? Heart attack Maternal Grandmother   ? Heart attack Maternal Grandfather   ? Depression Maternal Grandfather   ? Ovarian cancer Paternal Grandmother   ? Breast cancer Paternal Grandmother   ? Alcoholism Paternal Grandmother   ? ? ?ALLERGIES:  has No Known Allergies. ? ?MEDICATIONS:  ?Current Outpatient Medications  ?Medication Sig Dispense Refill  ? apixaban (ELIQUIS) 2.5 MG TABS tablet Take 1 tablet (2.5 mg total) by mouth 2 (two) times daily. 180 tablet 1  ? ?No current facility-administered medications for this visit.  ? ? ?REVIEW OF SYSTEMS:   ?Constitutional: Denies fevers, chills or abnormal night sweats ?Eyes: Denies blurriness of vision, double vision or watery eyes ?Ears, nose, mouth, throat, and face: Denies mucositis or sore throat ?Respiratory: Denies cough, dyspnea or wheezes ?Cardiovascular: Denies palpitation, chest discomfort or lower extremity swelling ?Gastrointestinal:  Denies nausea, heartburn or change in bowel habits ?Skin: Denies abnormal skin rashes ?Lymphatics: Denies new lymphadenopathy or easy bruising ?Neurological:Denies numbness, tingling or new weaknesses ?Behavioral/Psych: Mood is stable, no new changes  ?All other systems were reviewed with the patient and are negative. ? ?PHYSICAL EXAMINATION: ?ECOG PERFORMANCE STATUS: 0 - Asymptomatic ? ?Vitals:  ? 06/09/21 1353  ?BP: 120/86  ?Pulse: (!) 112  ?Resp: 18  ?Temp: 99 ?F (37.2 ?C)  ?SpO2: 100%  ? ?Filed Weights  ? 06/09/21 1353  ?Weight: 230 lb 3.2 oz (104.4 kg)  ? ? ?GENERAL:alert, no distress and comfortable ?SKIN: skin color, texture, turgor are normal, no rashes or significant lesions ?EYES: normal, conjunctiva are pink and non-injected, sclera clear ?OROPHARYNX:no exudate, no erythema and lips, buccal mucosa, and tongue normal  ?NECK: supple, thyroid normal size, non-tender, without nodularity ?LYMPH:  no palpable lymphadenopathy in  the cervical, axillary or inguinal ?LUNGS: clear to auscultation and percussion with normal breathing effort ?HEART: regular rate & rhythm and no murmurs and no lower extremity edema ?ABDOMEN:abdomen soft, non-tender and normal bowel sounds ?Musculoskeletal:no cyanosis of digits and no clubbing  ?PSYCH: alert & oriented x 3 with fluent speech ?NEURO: no focal motor/sensory deficits ? ?LABORATORY DATA:  ?I have reviewed the data as listed ?Lab Results  ?Component Value Date  ? WBC 8.0 06/07/2020  ? HGB 14.8 06/07/2020  ? HCT 43.6 06/07/2020  ? MCV 88 06/07/2020  ? PLT 293 06/07/2020  ? ?ASSESSMENT & PLAN:  ?Homozygous Factor V Leiden Mutation ?Unproven history of DVT ? ?They are at high risk for DVT but never proven to have history of DVT ?The patient has been on Eliquis for a long time since their ankle surgery ?They do not need to be on Eliquis indefinitely because of her genetic disorder ?I recommend discontinuation of Eliquis ?I recommend the patient to use elastic compression stockings at 20-30 mmHg and frequent ambulation for long distance travel. ?I advised the patient to increase oral  fluid intake on the regular basis as dehydration could be contributing factor for risk of DVT ?There is no contraindication for the patient to be on hormonal therapy if indicated, as long as the patient is willing to accept the risk of thrombosis ? ?Finally, at the end of our consultation today, I reinforced the importance of preventive strategies such as avoiding hormonal supplement, avoiding cigarette smoking, keeping up-to-date with screening programs for early cancer detection, frequent ambulation for long distance travel and aggressive DVT prophylaxis in all surgical settings. ? ?I have not made a return appointment for the patient to come back. I would be happy to assist in perioperative DVT management in the future should Erin FRAYRE "KJ" need any interruption of Erin SCALISI "AN"'V anticoagulation therapy for elective  procedures. ? ?All questions were answered. The patient knows to call the clinic with any problems, questions or concerns. ?The total time spent in the appointment was 80 minutes encounter with patients inc

## 2021-07-28 ENCOUNTER — Encounter: Payer: 59 | Admitting: Nurse Practitioner

## 2021-08-05 ENCOUNTER — Ambulatory Visit (INDEPENDENT_AMBULATORY_CARE_PROVIDER_SITE_OTHER): Payer: 59 | Admitting: Nurse Practitioner

## 2021-08-05 ENCOUNTER — Encounter: Payer: Self-pay | Admitting: Nurse Practitioner

## 2021-08-05 VITALS — BP 114/77 | HR 85 | Ht 62.99 in | Wt 240.4 lb

## 2021-08-05 DIAGNOSIS — R5383 Other fatigue: Secondary | ICD-10-CM

## 2021-08-05 DIAGNOSIS — R635 Abnormal weight gain: Secondary | ICD-10-CM | POA: Insufficient documentation

## 2021-08-05 DIAGNOSIS — Z6839 Body mass index (BMI) 39.0-39.9, adult: Secondary | ICD-10-CM | POA: Diagnosis not present

## 2021-08-05 DIAGNOSIS — F331 Major depressive disorder, recurrent, moderate: Secondary | ICD-10-CM

## 2021-08-05 DIAGNOSIS — Z0000 Encounter for general adult medical examination without abnormal findings: Secondary | ICD-10-CM | POA: Diagnosis not present

## 2021-08-05 DIAGNOSIS — E559 Vitamin D deficiency, unspecified: Secondary | ICD-10-CM | POA: Diagnosis not present

## 2021-08-05 DIAGNOSIS — Z0001 Encounter for general adult medical examination with abnormal findings: Secondary | ICD-10-CM

## 2021-08-05 NOTE — Progress Notes (Signed)
Complete physical exam   Patient: Erin Montgomery   DOB: 2000-12-25   21 y.o. Adult  MRN: [id number] Visit Date: 08/05/2021    Chief Complaint  Patient presents with   Annual Exam   Subjective    Erin Montgomery is a 21 y.o. adult who presents today for a complete physical exam.  Erin RIESEN "KJ" reports consuming a  generally healthy  diet.  Erin Bouchard "KJ" generally feels well. Erin SCARPATI "KJ" does not have additional problems to discuss today.   HPI  Annual physical.  Had well woman exam 04/07/2021 per gyn  --specimen was inadequate needs to have appointment to reschedule  -seeing hematology due to [place] - was advised to stop taking eliquis. Only needs to be seen again if problems arise or there is a need for surgery.  -has had some joint pain in the shoulder and knee. Does stretches and home exercises which doe help.  -has seen opthalmologist and is patching her strong eye. Is causing her to have some balance issues.  -due to have routine, fasting labs. She is fasting today.  -did score higher on her PHQ 9 - did fail a test for work yesterday. She is concerned about job [place]. She is open to seeing to see therapist. Would like to see therapist when when she feels more comfortable with job security.  -feeling very tired.    Past Medical History:  Diagnosis Date   Anxiety    Clotting disorder ([place])    Factor V Leiden   Depression    Dysmenorrhea    Past Surgical History:  Procedure Laterality Date   broken ankle Right    EYE SURGERY     x5   Social History   Socioeconomic History   Marital status: Single    Spouse name: Not on file   Number of children: Not on file   Years of education: Not on file   Highest education level: Not on file  Occupational History   Not on file  Tobacco Use   Smoking status: Never   Smokeless tobacco: Never  Vaping Use   Vaping Use: Never used  Substance and Sexual Activity   Alcohol use: Yes    Comment: 2  drinks/month   Drug use: Never   Sexual activity: Yes    Birth control/protection: None    Comment: female partner  Other Topics Concern   Not on file  Social History Narrative   Not on file   Social Determinants of Health   Financial Resource Strain: Not on file  Food Insecurity: Not on file  Transportation Needs: Not on file  Physical Activity: Not on file  Stress: Not on file  Social Connections: Not on file  Intimate Partner Violence: Not on file   Family Status  Relation Name Status   Mother  Alive   Father  Alive   Sister  Alive   [place]  (Not Specified)   MGM  Deceased   MGF  Deceased   PGM  Deceased   PGF  Deceased   Family History  Problem Relation Age of Onset   Hypertension Mother    Alcoholism Mother    Heart attack Mother    Depression Mother    High blood pressure Mother    High Cholesterol Mother    High blood pressure Father    High Cholesterol Father    Diabetes Maternal Aunt    Cancer Maternal Grandmother  colon cancer   Heart attack Maternal Grandmother    Heart attack Maternal Grandfather    Depression Maternal Grandfather    Ovarian cancer Paternal Grandmother    Breast cancer Paternal Grandmother    Alcoholism Paternal Grandmother    No Known Allergies  Patient Care Team: Ronnell Freshwater, NP as PCP - General (Family Medicine)   Medications: Outpatient Medications Prior to Visit  Medication Sig   MODERNA COVID-19 BIVALENT 50 MCG/0.5ML injection    [DISCONTINUED] apixaban (ELIQUIS) 2.5 MG TABS tablet Take 1 tablet (2.5 mg total) by mouth 2 (two) times daily.   No facility-administered medications prior to visit.    Review of Systems  Constitutional:  Positive for fatigue. Negative for activity change, chills and fever.       Weight gain of 10 pounds since most recent visit   HENT:  Negative for congestion, postnasal drip, rhinorrhea, sinus pressure, sinus pain, sneezing and sore throat.   Eyes: Negative.   Respiratory:   Negative for cough, shortness of breath and wheezing.   Cardiovascular:  Negative for chest pain and palpitations.  Gastrointestinal:  Negative for constipation, diarrhea, nausea and vomiting.  Endocrine: Negative for cold intolerance, heat intolerance, polydipsia and polyuria.  Genitourinary:  Negative for dysuria, frequency and urgency.  Musculoskeletal:  Negative for back pain and myalgias.  Skin:  Negative for rash.  Allergic/Immunologic: Negative for environmental allergies.  Neurological:  Negative for dizziness, weakness and headaches.  Psychiatric/Behavioral:  Positive for dysphoric mood. The patient is nervous/anxious.        Objective     Today's Vitals   08/05/21 0842  BP: 114/77  Pulse: 85  SpO2: 100%  Weight: 240 lb 6.4 oz (109 kg)  Height: 5' 2.99" (1.6 m)   Body mass index is 42.6 kg/m.   BP Readings from Last 3 Encounters:  08/05/21 114/77  06/09/21 120/86  04/07/21 122/80    Wt Readings from Last 3 Encounters:  08/05/21 240 lb 6.4 oz (109 kg)  06/09/21 230 lb 3.2 oz (104.4 kg)  04/07/21 227 lb (103 kg)     Physical Exam Vitals and nursing note reviewed.  Constitutional:      Appearance: Normal appearance. Erin FARACI "KJ" is well-developed. Erin INOUE "KJ" is obese.  HENT:     Head: Normocephalic and atraumatic.     Nose: Nose normal.     Mouth/Throat:     Mouth: Mucous membranes are moist.     Pharynx: Oropharynx is clear.  Eyes:     Conjunctiva/sclera: Conjunctivae normal.     Pupils: Pupils are equal, round, and reactive to light.  Cardiovascular:     Rate and Rhythm: Normal rate and regular rhythm.     Pulses: Normal pulses.     Heart sounds: Normal heart sounds.  Pulmonary:     Effort: Pulmonary effort is normal.     Breath sounds: Normal breath sounds.  Abdominal:     Palpations: Abdomen is soft.  Musculoskeletal:        General: Normal range of motion.     Cervical back: Normal range of motion and neck supple.   Lymphadenopathy:     Cervical: No cervical adenopathy.  Skin:    General: Skin is warm and dry.     Capillary Refill: Capillary refill takes less than 2 seconds.  Neurological:     General: No focal deficit present.     Mental Status: Erin PRIEGO "KJ" is alert and oriented to person,  place, and time.  Psychiatric:        Attention and Perception: Attention and perception normal.        Mood and Affect: Mood is depressed. Affect is tearful.        Speech: Speech normal.        Behavior: Behavior normal. Behavior is cooperative.        Thought Content: Thought content normal.        Cognition and Memory: Cognition and memory normal.        Judgment: Judgment normal.      Last depression screening scores    08/05/2021    8:45 AM 01/28/2021    3:52 PM 06/28/2020   11:07 AM  PHQ 2/9 Scores  PHQ - 2 Score 2 0 2  PHQ- 9 Score '10 3 9   '$ Last fall risk screening    01/28/2021    3:52 PM  [place] in the past year? 0  Number falls in past yr: 0  Injury with Fall? 0  Follow up Falls evaluation completed      Assessment & Plan    1. Encounter for general adult medical examination with abnormal findings Annual physical   2. Other fatigue Check thyroid and anemia panels, along with CMP and HgbA1c  - TSH + free T4; Future - Hemoglobin A1c; Future - CBC; Future - Iron, TIBC and Ferritin Panel; Future - Vitamin B12; Future - Vitamin B12 - Iron, TIBC and Ferritin Panel - CBC - TSH + free T4 - Hemoglobin A1c  3. Moderate episode of recurrent major depressive disorder ([place]) Patient did have high PHQ 9 today, higher than prior visits. Offered her a referral to counseling services. She would like to accept this, however, she is unsure how long her job is going to last and unsure of insurance benefits. She states that increased anxiety/depression likely due to work related stressors at this time. Will contact me if she changes her mind or if symptoms worsen, especially of  she starts to have feelings of self harm or suicidality.   4. Vitamin D deficiency Check vitamin d level and treat deficiency as indicated  - VITAMIN D 25 Hydroxy (Vit-D Deficiency, Fractures); Future - VITAMIN D 25 Hydroxy (Vit-D Deficiency, Fractures)  5. Abnormal weight gain Check thyroid panel  - TSH + free T4; Future - TSH + free T4  6. Body mass index (BMI) of 39.0-39.9 in adult Discussed lowering calorie intake to 1500 calories per day and incorporating exercise into daily routine to help lose weight. She does express interest in increasing her exercise to help manage weight.  7. Healthcare maintenance Routine, fasting labs drawn during today's visit.  - TSH + free T4; Future - Hemoglobin A1c; Future - Lipid panel; Future - Comprehensive metabolic panel; Future - CBC; Future - Iron, TIBC and Ferritin Panel; Future - Vitamin B12; Future - Vitamin B12 - Iron, TIBC and Ferritin Panel - CBC - Comprehensive metabolic panel - Lipid panel - TSH + free T4 - Hemoglobin A1c     Immunization History  Administered Date(s) Administered   Influenza,inj,Quad PF,6+ Mos 10/14/2020   Moderna SARS-COV2 Booster Vaccination 05/30/2020   Pfizer Covid-19 Vaccine Bivalent Booster 83yr & up 10/14/2020   Tdap 04/07/2021    Health Maintenance  Topic Date Due   HPV VACCINES (1 - 2-dose series) Never done   INFLUENZA VACCINE  08/26/2021   PAP-Cervical Cytology Screening  04/07/2024   PAP SMEAR-Modifier  04/07/2024  TETANUS/TDAP  04/08/2031   COVID-19 Vaccine  Completed   Hepatitis C Screening  Completed   HIV Screening  Completed    Discussed health benefits of physical activity, and encouraged Erin WELTMAN "KJ" to engage in regular exercise appropriate for Erin LAI "KJ"'s age and condition.  Problem List Items Addressed This Visit       Other   Body mass index (BMI) of 39.0-39.9 in adult   Other fatigue   Relevant Orders   TSH + free T4   Hemoglobin A1c   CBC    Iron, TIBC and Ferritin Panel   Vitamin B12   Moderate episode of recurrent major depressive disorder (HCC)   Vitamin D deficiency   Relevant Orders   VITAMIN D 25 Hydroxy (Vit-D Deficiency, Fractures)   Abnormal weight gain   Relevant Orders   TSH + free T4   Other Visit Diagnoses     Encounter for general adult medical examination with abnormal findings    -  Primary   Healthcare maintenance       Relevant Orders   TSH + free T4   Hemoglobin A1c   Lipid panel   Comprehensive metabolic panel   CBC   Iron, TIBC and Ferritin Panel   Vitamin B12        Return in about 6 months (around 02/05/2022) for stress, fatigue .        Ronnell Freshwater, NP  [facility] at [facility] [phone] (phone) [phone] (fax)  [place]

## 2021-08-06 ENCOUNTER — Encounter: Payer: Self-pay | Admitting: Nurse Practitioner

## 2021-08-06 ENCOUNTER — Other Ambulatory Visit: Payer: Self-pay | Admitting: Nurse Practitioner

## 2021-08-06 DIAGNOSIS — E559 Vitamin D deficiency, unspecified: Secondary | ICD-10-CM

## 2021-08-06 LAB — COMPREHENSIVE METABOLIC PANEL
ALT: 23 IU/L (ref 0–32)
AST: 18 IU/L (ref 0–40)
Albumin/Globulin Ratio: 1.7 (ref 1.2–2.2)
Albumin: 4.7 g/dL (ref 4.0–5.0)
Alkaline Phosphatase: 86 IU/L (ref 44–121)
BUN/Creatinine Ratio: 22 (ref 9–23)
BUN: 14 mg/dL (ref 6–20)
Bilirubin Total: 0.4 mg/dL (ref 0.0–1.2)
CO2: 23 mmol/L (ref 20–29)
Calcium: 9.6 mg/dL (ref 8.7–10.2)
Chloride: 103 mmol/L (ref 96–106)
Creatinine, Ser: 0.65 mg/dL (ref 0.57–1.00)
Globulin, Total: 2.7 g/dL (ref 1.5–4.5)
Glucose: 86 mg/dL (ref 70–99)
Potassium: 4.6 mmol/L (ref 3.5–5.2)
Sodium: 141 mmol/L (ref 134–144)
Total Protein: 7.4 g/dL (ref 6.0–8.5)
eGFR: 128 mL/min/{1.73_m2} (ref >59)

## 2021-08-06 LAB — LIPID PANEL
Chol/HDL Ratio: 3.9 ratio (ref 0.0–4.4)
Cholesterol, Total: 155 mg/dL (ref 100–199)
HDL: 40 mg/dL (ref >39)
LDL Chol Calc (NIH): 91 mg/dL (ref 0–99)
Triglycerides: 135 mg/dL (ref 0–149)
VLDL Cholesterol Cal: 24 mg/dL (ref 5–40)

## 2021-08-06 LAB — CBC
Hematocrit: 44.2 % (ref 34.0–46.6)
Hemoglobin: 14.8 g/dL (ref 11.1–15.9)
MCH: 29.4 pg (ref 26.6–33.0)
MCHC: 33.5 g/dL (ref 31.5–35.7)
MCV: 88 fL (ref 79–97)
Platelets: 289 10*3/uL (ref 150–450)
RBC: 5.04 x10E6/uL (ref 3.77–5.28)
RDW: 13.1 % (ref 11.7–15.4)
WBC: 7.2 10*3/uL (ref 3.4–10.8)

## 2021-08-06 LAB — VITAMIN D 25 HYDROXY (VIT D DEFICIENCY, FRACTURES): Vit D, 25-Hydroxy: 20.2 ng/mL — ABNORMAL LOW (ref 30.0–100.0)

## 2021-08-06 LAB — IRON,TIBC AND FERRITIN PANEL
Ferritin: 107 ng/mL (ref 15–150)
Iron Saturation: 16 % (ref 15–55)
Iron: 61 ug/dL (ref 27–159)
Total Iron Binding Capacity: 385 ug/dL (ref 250–450)
UIBC: 324 ug/dL (ref 131–425)

## 2021-08-06 LAB — TSH+FREE T4
Free T4: 1.25 ng/dL (ref 0.82–1.77)
TSH: 2.53 u[IU]/mL (ref 0.450–4.500)

## 2021-08-06 LAB — HEMOGLOBIN A1C
Est. average glucose Bld gHb Est-mCnc: 94 mg/dL
Hgb A1c MFr Bld: 4.9 % (ref 4.8–5.6)

## 2021-08-06 LAB — VITAMIN B12: Vitamin B-12: 408 pg/mL (ref 232–1245)

## 2021-08-06 MED ORDER — ERGOCALCIFEROL 1.25 MG (50000 UT) PO CAPS: 50000.0000 [IU] | ORAL_CAPSULE | ORAL | 5 refills | Status: DC

## 2021-08-06 NOTE — Progress Notes (Signed)
Please let the patient know that her labs are back. Her vitamin d was a bit low. I have added Drisdol, high dose vitamin d. This is taken weekly for next few months. I have sent the prescription to [facility] on [address].  All other labs were normal.  Thanks so much.   -HB

## 2021-09-25 ENCOUNTER — Encounter: Payer: Self-pay | Admitting: Nurse Practitioner

## 2021-10-08 HISTORY — PX: OTHER SURGICAL HISTORY: SHX169

## 2021-10-22 ENCOUNTER — Encounter: Payer: Self-pay | Admitting: Nurse Practitioner

## 2021-10-26 NOTE — Progress Notes (Signed)
Established patient visit   Patient: Erin Montgomery   DOB: 23-Jan-2001   21 y.o. Adult  MRN: [id number] Visit Date: 10/27/2021   Chief Complaint  Patient presents with   Tachycardia   Subjective    HPI  Follow up visit -episodes of rapid heart rate  --ECG and heart rate measured per patient's apple watch.  -ecg indicated sinus rhythm with elevated heart rate.  -will need ECG during today's visit. Possible referral to EP specialist.  -states that heart rate jumps a great deal when she goes from lying to sitting and from sitting to standing.  -sees starts. Feels like she is going to pass out.  -slight exertion causes this to happen.  -concern for POTS. Has been discussed at previous visits  -ECG today is within normal limits  -states that she does drink plenty of water. Drinks more than 64 ounces of water today.   Medications: Outpatient Medications Prior to Visit  Medication Sig   ergocalciferol (DRISDOL) 1.25 MG (50000 UT) capsule Take 1 capsule (50,000 Units total) by mouth once a week.   MODERNA COVID-19 BIVALENT 50 MCG/0.5ML injection    No facility-administered medications prior to visit.    Review of Systems  Constitutional:  Positive for fatigue. Negative for activity change, chills and fever.  HENT:  Negative for congestion, postnasal drip, rhinorrhea, sinus pressure, sinus pain, sneezing and sore throat.   Eyes: Negative.   Respiratory:  Positive for shortness of breath. Negative for cough and wheezing.   Cardiovascular:  Positive for palpitations. Negative for chest pain.  Gastrointestinal:  Negative for constipation, diarrhea, nausea and vomiting.  Endocrine: Negative for cold intolerance, heat intolerance, polydipsia and polyuria.  Genitourinary:  Negative for dysuria, frequency and urgency.  Musculoskeletal:  Negative for back pain and myalgias.  Skin:  Negative for rash.  Allergic/Immunologic: Negative for environmental allergies.  Neurological:  Positive for  dizziness and headaches. Negative for weakness.  Psychiatric/Behavioral:  The patient is nervous/anxious.     Last CBC Lab Results  Component Value Date   WBC 7.2 08/05/2021   HGB 14.8 08/05/2021   HCT 44.2 08/05/2021   MCV 88 08/05/2021   MCH 29.4 08/05/2021   RDW 13.1 08/05/2021   PLT 289 40/98/1191   Last metabolic panel Lab Results  Component Value Date   GLUCOSE 86 08/05/2021   NA 141 08/05/2021   K 4.6 08/05/2021   CL 103 08/05/2021   CO2 23 08/05/2021   BUN 14 08/05/2021   CREATININE 0.65 08/05/2021   EGFR 128 08/05/2021   CALCIUM 9.6 08/05/2021   PROT 7.4 08/05/2021   ALBUMIN 4.7 08/05/2021   LABGLOB 2.7 08/05/2021   AGRATIO 1.7 08/05/2021   BILITOT 0.4 08/05/2021   ALKPHOS 86 08/05/2021   AST 18 08/05/2021   ALT 23 08/05/2021   Last lipids Lab Results  Component Value Date   CHOL 155 08/05/2021   HDL 40 08/05/2021   LDLCALC 91 08/05/2021   TRIG 135 08/05/2021   CHOLHDL 3.9 08/05/2021   Last hemoglobin A1c Lab Results  Component Value Date   HGBA1C 4.9 08/05/2021   Last thyroid functions Lab Results  Component Value Date   TSH 2.530 08/05/2021   Last vitamin D Lab Results  Component Value Date   VD25OH 20.2 (L) 08/05/2021   Last vitamin B12 and Folate Lab Results  Component Value Date   VITAMINB12 408 08/05/2021       Objective  Today's Vitals   10/27/21 1514  BP: 128/84  Pulse: 96  SpO2: 99%  Weight: 239 lb (108.4 kg)  Height: '5\' 2"'  (1.575 m)   Body mass index is 43.71 kg/m.   BP Readings from Last 3 Encounters:  10/27/21 128/84  08/05/21 114/77  06/09/21 120/86    Wt Readings from Last 3 Encounters:  10/27/21 239 lb (108.4 kg)  08/05/21 240 lb 6.4 oz (109 kg)  06/09/21 230 lb 3.2 oz (104.4 kg)    Physical Exam Vitals and nursing note reviewed.  Constitutional:      Appearance: Normal appearance. HARLIE BUENING "KJ" is well-developed. JONEL SICK "KJ" is obese.  HENT:     Head: Normocephalic and atraumatic.   Eyes:     Pupils: Pupils are equal, round, and reactive to light.  Cardiovascular:     Rate and Rhythm: Normal rate and regular rhythm.     Pulses: Normal pulses.     Heart sounds: Normal heart sounds.     Comments: EKG today is within normal limits  Pulmonary:     Effort: Pulmonary effort is normal.     Breath sounds: Normal breath sounds.  Abdominal:     Palpations: Abdomen is soft.  Musculoskeletal:        General: Normal range of motion.     Cervical back: Normal range of motion and neck supple.  Lymphadenopathy:     Cervical: No cervical adenopathy.  Skin:    General: Skin is warm and dry.     Capillary Refill: Capillary refill takes less than 2 seconds.  Neurological:     General: No focal deficit present.     Mental Status: SARYNA KNEELAND "KJ" is alert and oriented to person, place, and time.  Psychiatric:        Attention and Perception: Attention and perception normal.        Mood and Affect: Affect normal. Mood is anxious.        Speech: Speech normal.        Behavior: Behavior normal. Behavior is cooperative.        Thought Content: Thought content normal.        Cognition and Memory: Cognition and memory normal.        Judgment: Judgment normal.       Assessment & Plan    1. Palpitation EKG done during today's visit is within normal limits.  Labs essentially normal.  Refer to cardiology, specifically EP specialist, for further evaluation. - EKG 12-Lead  2. Postural orthostatic tachycardia syndrome  Suspect POTS as cause of palpitations and near syncopal episodes.  EKG today normal. Refer to cardiology, specifically EP specialist, for further evaluation. - Ambulatory referral to Cardiac Electrophysiology   Problem List Items Addressed This Visit       Cardiovascular and Mediastinum   Postural orthostatic tachycardia syndrome   Relevant Orders   Ambulatory referral to Cardiac Electrophysiology     Other   Palpitation - Primary   Relevant Orders   EKG  12-Lead (Completed)     Return for as scheduled.         Ronnell Freshwater, NP  [facility] at [facility] [phone] (phone) [phone] (fax)  [place]

## 2021-10-27 ENCOUNTER — Encounter: Payer: Self-pay | Admitting: Nurse Practitioner

## 2021-10-27 ENCOUNTER — Ambulatory Visit (INDEPENDENT_AMBULATORY_CARE_PROVIDER_SITE_OTHER): Payer: Commercial Managed Care - PPO | Admitting: Nurse Practitioner

## 2021-10-27 ENCOUNTER — Telehealth: Payer: Self-pay

## 2021-10-27 VITALS — BP 128/84 | HR 96 | Ht 62.0 in | Wt 239.0 lb

## 2021-10-27 DIAGNOSIS — G90A Postural orthostatic tachycardia syndrome (POTS): Secondary | ICD-10-CM | POA: Diagnosis not present

## 2021-10-27 DIAGNOSIS — R002 Palpitations: Secondary | ICD-10-CM

## 2021-10-27 NOTE — Telephone Encounter (Signed)
Yes. She can play rugmy. She needs to stay hydrated and, if she feels bad, she should sit and take short rest.

## 2021-10-27 NOTE — Telephone Encounter (Signed)
Patient has an appointment today

## 2021-10-27 NOTE — Telephone Encounter (Signed)
Patient was seen in office today and would like to know if she's able to play rugby, please advise, thanks!

## 2021-10-31 ENCOUNTER — Telehealth: Payer: Commercial Managed Care - PPO | Admitting: Physician Assistant

## 2021-10-31 DIAGNOSIS — J208 Acute bronchitis due to other specified organisms: Secondary | ICD-10-CM | POA: Diagnosis not present

## 2021-10-31 DIAGNOSIS — B9689 Other specified bacterial agents as the cause of diseases classified elsewhere: Secondary | ICD-10-CM | POA: Diagnosis not present

## 2021-10-31 MED ORDER — ALBUTEROL SULFATE HFA 108 (90 BASE) MCG/ACT IN AERS: 1.0000 | INHALATION_SPRAY | Freq: Four times a day (QID) | RESPIRATORY_TRACT | 0 refills | Status: DC | PRN

## 2021-10-31 MED ORDER — PREDNISONE 10 MG (21) PO TBPK: ORAL_TABLET | ORAL | 0 refills | Status: DC

## 2021-10-31 MED ORDER — PSEUDOEPH-BROMPHEN-DM 30-2-10 MG/5ML PO SYRP: 5.0000 mL | ORAL_SOLUTION | Freq: Four times a day (QID) | ORAL | 0 refills | Status: DC | PRN

## 2021-10-31 MED ORDER — AZITHROMYCIN 250 MG PO TABS: ORAL_TABLET | ORAL | 0 refills | Status: AC

## 2021-10-31 NOTE — Patient Instructions (Signed)
Kirtland Bouchard, thank you for joining Mar Daring, PA-C for today's virtual visit.  While this provider is not your primary care provider (PCP), if your PCP is located in our provider database this encounter information will be shared with them immediately following your visit.  Consent: (Patient) Erin Montgomery provided verbal consent for this virtual visit at the beginning of the encounter.  Current Medications:  Current Outpatient Medications:    albuterol (VENTOLIN HFA) 108 (90 Base) MCG/ACT inhaler, Inhale 1-2 puffs into the lungs every 6 (six) hours as needed for wheezing or shortness of breath., Disp: 8 g, Rfl: 0   azithromycin (ZITHROMAX) 250 MG tablet, Take 2 tablets on day 1, then 1 tablet daily on days 2 through 5, Disp: 6 tablet, Rfl: 0   brompheniramine-pseudoephedrine-DM 30-2-10 MG/5ML syrup, Take 5 mLs by mouth 4 (four) times daily as needed., Disp: 120 mL, Rfl: 0   predniSONE (STERAPRED UNI-PAK 21 TAB) 10 MG (21) TBPK tablet, 6 day taper; take as directed on package instructions, Disp: 21 tablet, Rfl: 0   ergocalciferol (DRISDOL) 1.25 MG (50000 UT) capsule, Take 1 capsule (50,000 Units total) by mouth once a week., Disp: 4 capsule, Rfl: 5   MODERNA COVID-19 BIVALENT 50 MCG/0.5ML injection, , Disp: , Rfl:    Medications ordered in this encounter:  Meds ordered this encounter  Medications   azithromycin (ZITHROMAX) 250 MG tablet    Sig: Take 2 tablets on day 1, then 1 tablet daily on days 2 through 5    Dispense:  6 tablet    Refill:  0    Order Specific Question:   Supervising Provider    Answer:   Chase Picket [[id number]]   predniSONE (STERAPRED UNI-PAK 21 TAB) 10 MG (21) TBPK tablet    Sig: 6 day taper; take as directed on package instructions    Dispense:  21 tablet    Refill:  0    Order Specific Question:   Supervising Provider    Answer:   Chase Picket [[id number]]   albuterol (VENTOLIN HFA) 108 (90 Base) MCG/ACT inhaler    Sig: Inhale 1-2 puffs into  the lungs every 6 (six) hours as needed for wheezing or shortness of breath.    Dispense:  8 g    Refill:  0    Order Specific Question:   Supervising Provider    Answer:   Chase Picket [id number]   brompheniramine-pseudoephedrine-DM 30-2-10 MG/5ML syrup    Sig: Take 5 mLs by mouth 4 (four) times daily as needed.    Dispense:  120 mL    Refill:  0    Order Specific Question:   Supervising Provider    Answer:   Chase Picket [[id number]]     *If you need refills on other medications prior to your next appointment, please contact your pharmacy*  Follow-Up: Call back or seek an in-person evaluation if the symptoms worsen or if the condition fails to improve as anticipated.  [facility] [phone]  Other Instructions  Acute Bronchitis, Adult  Acute bronchitis is sudden inflammation of the main airways (bronchi) that come off the windpipe (trachea) in the lungs. The swelling causes the airways to get smaller and make more mucus than normal. This can make it hard to breathe and can cause coughing or noisy breathing (wheezing). Acute bronchitis may last several weeks. The cough may last longer. Allergies, asthma, and exposure to smoke may make the condition worse. What are  the causes? This condition can be caused by germs and by substances that irritate the lungs, including: Cold and flu viruses. The most common cause of this condition is the virus that causes the common cold. Bacteria. This is less common. Breathing in substances that irritate the lungs, including: Smoke from cigarettes and other forms of tobacco. Dust and pollen. Fumes from household cleaning products, gases, or burned fuel. Indoor or outdoor air pollution. What increases the risk? The following factors may make you more likely to develop this condition: A weak body's defense system, also called the immune system. A condition that affects your lungs and breathing, such as asthma. What are the  signs or symptoms? Common symptoms of this condition include: Coughing. This may bring up clear, yellow, or green mucus from your lungs (sputum). Wheezing. Runny or stuffy nose. Having too much mucus in your lungs (chest congestion). Shortness of breath. Aches and pains, including sore throat or chest. How is this diagnosed? This condition is usually diagnosed based on: Your symptoms and medical history. A physical exam. You may also have other tests, including tests to rule out other conditions, such as pneumonia. These tests include: A test of lung function. Test of a mucus sample to look for the presence of bacteria. Tests to check the oxygen level in your blood. Blood tests. Chest X-ray. How is this treated? Most cases of acute bronchitis clear up over time without treatment. Your health care provider may recommend: Drinking more fluids to help thin your mucus so it is easier to cough up. Taking inhaled medicine (inhaler) to improve air flow in and out of your lungs. Using a vaporizer or a humidifier. These are machines that add water to the air to help you breathe better. Taking a medicine that thins mucus and clears congestion (expectorant). Taking a medicine that prevents or stops coughing (cough suppressant). It is not common to take an antibiotic medicine for this condition. Follow these instructions at home:  Take over-the-counter and prescription medicines only as told by your health care provider. Use an inhaler, vaporizer, or humidifier as told by your health care provider. Take two teaspoons (10 mL) of honey at bedtime to lessen coughing at night. Drink enough fluid to keep your urine pale yellow. Do not use any products that contain nicotine or tobacco. These products include cigarettes, chewing tobacco, and vaping devices, such as e-cigarettes. If you need help quitting, ask your health care provider. Get plenty of rest. Return to your normal activities as told by  your health care provider. Ask your health care provider what activities are safe for you. Keep all follow-up visits. This is important. How is this prevented? To lower your risk of getting this condition again: Wash your hands often with soap and water for at least 20 seconds. If soap and water are not available, use hand sanitizer. Avoid contact with people who have cold symptoms. Try not to touch your mouth, nose, or eyes with your hands. Avoid breathing in smoke or chemical fumes. Breathing smoke or chemical fumes will make your condition worse. Get the flu shot every year. Contact a health care provider if: Your symptoms do not improve after 2 weeks. You have trouble coughing up the mucus. Your cough keeps you awake at night. You have a fever. Get help right away if you: Cough up blood. Feel pain in your chest. Have severe shortness of breath. Faint or keep feeling like you are going to faint. Have a severe headache. Have  a fever or chills that get worse. These symptoms may represent a serious problem that is an emergency. Do not wait to see if the symptoms will go away. Get medical help right away. Call your local emergency services (911 in the [place] Do not drive yourself to the hospital. Summary Acute bronchitis is inflammation of the main airways (bronchi) that come off the windpipe (trachea) in the lungs. The swelling causes the airways to get smaller and make more mucus than normal. Drinking more fluids can help thin your mucus so it is easier to cough up. Take over-the-counter and prescription medicines only as told by your health care provider. Do not use any products that contain nicotine or tobacco. These products include cigarettes, chewing tobacco, and vaping devices, such as e-cigarettes. If you need help quitting, ask your health care provider. Contact a health care provider if your symptoms do not improve after 2 weeks. This information is not intended to replace  advice given to you by your health care provider. Make sure you discuss any questions you have with your health care provider. Document Revised: 04/24/2021 Document Reviewed: 05/15/2020 Elsevier Patient Education  [place].    If you have been instructed to have an in-person evaluation today at a local Urgent Care facility, please use the link below. It will take you to a list of all of our available [facility], including address, phone number and hours of operation. Please do not delay care.  [facility]  If you or a family member do not have a primary care provider, use the link below to schedule a visit and establish care. When you choose a [facility] primary care physician or advanced practice provider, you gain a long-term partner in health. Find a Primary Care Provider  Learn more about [facility]'s in-office and virtual care options: [facility]

## 2021-10-31 NOTE — Progress Notes (Signed)
Virtual Visit Consent   Erin Montgomery, you are scheduled for a virtual visit with a [facility] provider today. Just as with appointments in the office, your consent must be obtained to participate. Your consent will be active for this visit and any virtual visit you may have with one of our providers in the next 365 days. If you have a MyChart account, a copy of this consent can be sent to you electronically.  As this is a virtual visit, video technology does not allow for your provider to perform a traditional examination. This may limit your provider's ability to fully assess your condition. If your provider identifies any concerns that need to be evaluated in person or the need to arrange testing (such as labs, EKG, etc.), we will make arrangements to do so. Although advances in technology are sophisticated, we cannot ensure that it will always work on either your end or our end. If the connection with a video visit is poor, the visit may have to be switched to a telephone visit. With either a video or telephone visit, we are not always able to ensure that we have a secure connection.  By engaging in this virtual visit, you consent to the provision of healthcare and authorize for your insurance to be billed (if applicable) for the services provided during this visit. Depending on your insurance coverage, you may receive a charge related to this service.  I need to obtain your verbal consent now. Are you willing to proceed with your visit today? Erin Montgomery has provided verbal consent on 10/31/2021 for a virtual visit (video or telephone). Mar Daring, PA-C  Date: 10/31/2021 10:30 AM  Virtual Visit via Video Note   I, Mar Daring, connected with  Erin Montgomery  ([id number], 11/16/2000) on 10/31/21 at 10:30 AM EDT by a video-enabled telemedicine application and verified that I am speaking with the correct person using two identifiers.  Location: Patient: Virtual Visit Location  Patient: Home Provider: Virtual Visit Location Provider: Home Office   I discussed the limitations of evaluation and management by telemedicine and the availability of in person appointments. The patient expressed understanding and agreed to proceed.    History of Present Illness: Erin Montgomery is a 21 y.o. who identifies as a nonbinary who was assigned adult at birth, and is being seen today for Cough.  HPI: Cough This is a new problem. The current episode started in the past 7 days (Monday). The problem has been gradually worsening. The problem occurs every few minutes. The cough is Productive of sputum and productive of purulent sputum. Associated symptoms include chills, a fever (101), rhinorrhea (now improved), a sore throat and wheezing (today). Pertinent negatives include no hemoptysis, nasal congestion, postnasal drip or shortness of breath. Associated symptoms comments: Feels like breathing in glass. The symptoms are aggravated by lying down. Treatments tried: dayquil, nyquil, immunity shots, ricola cough drops, hot tea with honey. The treatment provided no relief. Erin BRANDNER "KJ"'s past medical history is significant for bronchitis. There is no history of asthma or pneumonia.     Problems:  Patient Active Problem List   Diagnosis Date Noted   Other fatigue 08/05/2021   Moderate episode of recurrent major depressive disorder ([place]) 08/05/2021   Vitamin D deficiency 08/05/2021   Abnormal weight gain 08/05/2021   Body mass index (BMI) of 39.0-39.9 in adult 02/02/2021   Encounter for tubal ligation counseling 07/29/2020   Elevated prolactin level 07/07/2020  Lazy eye of left side 07/07/2020   Encounter to establish care 06/07/2020   Factor V Leiden ([place]) 06/07/2020   History of DVT in adulthood 06/07/2020   Amenorrhea 06/07/2020   Generalized anxiety disorder 06/07/2020   Attention deficit hyperactivity disorder (ADHD) 06/07/2020   History of suicide attempt 06/07/2020     Allergies: No Known Allergies Medications:  Current Outpatient Medications:    albuterol (VENTOLIN HFA) 108 (90 Base) MCG/ACT inhaler, Inhale 1-2 puffs into the lungs every 6 (six) hours as needed for wheezing or shortness of breath., Disp: 8 g, Rfl: 0   azithromycin (ZITHROMAX) 250 MG tablet, Take 2 tablets on day 1, then 1 tablet daily on days 2 through 5, Disp: 6 tablet, Rfl: 0   brompheniramine-pseudoephedrine-DM 30-2-10 MG/5ML syrup, Take 5 mLs by mouth 4 (four) times daily as needed., Disp: 120 mL, Rfl: 0   predniSONE (STERAPRED UNI-PAK 21 TAB) 10 MG (21) TBPK tablet, 6 day taper; take as directed on package instructions, Disp: 21 tablet, Rfl: 0   ergocalciferol (DRISDOL) 1.25 MG (50000 UT) capsule, Take 1 capsule (50,000 Units total) by mouth once a week., Disp: 4 capsule, Rfl: 5   MODERNA COVID-19 BIVALENT 50 MCG/0.5ML injection, , Disp: , Rfl:   Observations/Objective: Patient is well-developed, well-nourished in no acute distress.  Resting comfortably at home.  Head is normocephalic, atraumatic.  No labored breathing.  Speech is clear and coherent with logical content.  Patient is alert and oriented at baseline.    Assessment and Plan: 1. Acute bacterial bronchitis - azithromycin (ZITHROMAX) 250 MG tablet; Take 2 tablets on day 1, then 1 tablet daily on days 2 through 5  Dispense: 6 tablet; Refill: 0 - predniSONE (STERAPRED UNI-PAK 21 TAB) 10 MG (21) TBPK tablet; 6 day taper; take as directed on package instructions  Dispense: 21 tablet; Refill: 0 - albuterol (VENTOLIN HFA) 108 (90 Base) MCG/ACT inhaler; Inhale 1-2 puffs into the lungs every 6 (six) hours as needed for wheezing or shortness of breath.  Dispense: 8 g; Refill: 0 - brompheniramine-pseudoephedrine-DM 30-2-10 MG/5ML syrup; Take 5 mLs by mouth 4 (four) times daily as needed.  Dispense: 120 mL; Refill: 0  - Worsening over a week despite OTC medications - Will treat with Z-pack, Prednisone, Albuterol, and Bromfed DM -  Can continue Mucinex  - Push fluids.  - Rest.  - Steam and humidifier can help - Seek in person evaluation if worsening or symptoms fail to improve    Follow Up Instructions: I discussed the assessment and treatment plan with the patient. The patient was provided an opportunity to ask questions and all were answered. The patient agreed with the plan and demonstrated an understanding of the instructions.  A copy of instructions were sent to the patient via MyChart unless otherwise noted below.    The patient was advised to call back or seek an in-person evaluation if the symptoms worsen or if the condition fails to improve as anticipated.  Time:  I spent 10 minutes with the patient via telehealth technology discussing the above problems/concerns.    Mar Daring, PA-C

## 2021-11-06 ENCOUNTER — Telehealth: Payer: Commercial Managed Care - PPO | Admitting: Physician Assistant

## 2021-11-06 DIAGNOSIS — B9689 Other specified bacterial agents as the cause of diseases classified elsewhere: Secondary | ICD-10-CM

## 2021-11-06 DIAGNOSIS — J208 Acute bronchitis due to other specified organisms: Secondary | ICD-10-CM | POA: Diagnosis not present

## 2021-11-06 MED ORDER — PREDNISONE 20 MG PO TABS: 40.0000 mg | ORAL_TABLET | Freq: Every day | ORAL | 0 refills | Status: DC

## 2021-11-06 MED ORDER — BENZONATATE 100 MG PO CAPS: 100.0000 mg | ORAL_CAPSULE | Freq: Three times a day (TID) | ORAL | 0 refills | Status: DC | PRN

## 2021-11-06 MED ORDER — LEVOFLOXACIN 500 MG PO TABS: 500.0000 mg | ORAL_TABLET | Freq: Every day | ORAL | 0 refills | Status: AC

## 2021-11-06 NOTE — Patient Instructions (Signed)
Kirtland Bouchard, thank you for joining Mar Daring, PA-C for today's virtual visit.  While this provider is not your primary care provider (PCP), if your PCP is located in our provider database this encounter information will be shared with them immediately following your visit.  Consent: (Patient) Erin Montgomery provided verbal consent for this virtual visit at the beginning of the encounter.  Current Medications:  Current Outpatient Medications:    benzonatate (TESSALON) 100 MG capsule, Take 1 capsule (100 mg total) by mouth 3 (three) times daily as needed., Disp: 30 capsule, Rfl: 0   levofloxacin (LEVAQUIN) 500 MG tablet, Take 1 tablet (500 mg total) by mouth daily for 7 days., Disp: 7 tablet, Rfl: 0   predniSONE (DELTASONE) 20 MG tablet, Take 2 tablets (40 mg total) by mouth daily with breakfast., Disp: 10 tablet, Rfl: 0   albuterol (VENTOLIN HFA) 108 (90 Base) MCG/ACT inhaler, Inhale 1-2 puffs into the lungs every 6 (six) hours as needed for wheezing or shortness of breath., Disp: 8 g, Rfl: 0   brompheniramine-pseudoephedrine-DM 30-2-10 MG/5ML syrup, Take 5 mLs by mouth 4 (four) times daily as needed., Disp: 120 mL, Rfl: 0   ergocalciferol (DRISDOL) 1.25 MG (50000 UT) capsule, Take 1 capsule (50,000 Units total) by mouth once a week., Disp: 4 capsule, Rfl: 5   MODERNA COVID-19 BIVALENT 50 MCG/0.5ML injection, , Disp: , Rfl:    Medications ordered in this encounter:  Meds ordered this encounter  Medications   levofloxacin (LEVAQUIN) 500 MG tablet    Sig: Take 1 tablet (500 mg total) by mouth daily for 7 days.    Dispense:  7 tablet    Refill:  0    Order Specific Question:   Supervising Provider    Answer:   Chase Picket [[id number]]   predniSONE (DELTASONE) 20 MG tablet    Sig: Take 2 tablets (40 mg total) by mouth daily with breakfast.    Dispense:  10 tablet    Refill:  0    Order Specific Question:   Supervising Provider    Answer:   Chase Picket [[id number]]    benzonatate (TESSALON) 100 MG capsule    Sig: Take 1 capsule (100 mg total) by mouth 3 (three) times daily as needed.    Dispense:  30 capsule    Refill:  0    Order Specific Question:   Supervising Provider    Answer:   Chase Picket [id number]     *If you need refills on other medications prior to your next appointment, please contact your pharmacy*  Follow-Up: Call back or seek an in-person evaluation if the symptoms worsen or if the condition fails to improve as anticipated.  [facility] [phone]  Other Instructions Community-Acquired Pneumonia, Adult Pneumonia is an infection of the lungs. It causes irritation and swelling in the airways of the lungs. Mucus and fluid may also build up inside the airways. This may cause coughing and trouble breathing. One type of pneumonia can happen while you are in a hospital. A different type can happen when you are not in a hospital (community-acquired pneumonia). What are the causes?  This condition is caused by germs (viruses, bacteria, or fungi). Some types of germs can spread from person to person. Pneumonia is not thought to spread from person to person. What increases the risk? You have a long-term (chronic) disease, such as: Disease of the lungs. This may be chronic obstructive pulmonary disease (COPD) or  asthma. Heart failure. Cystic fibrosis. Diabetes. Kidney disease. Sickle cell disease. HIV. You have other health problems, such as: Your body's defense system (immune system) is weak. A condition that may cause you to breathe in fluids from your mouth and nose. You had your spleen taken out. You do not take good care of your teeth and mouth (poor dental hygiene). You use or have used tobacco products. You go where the germs that cause this illness are common. You are older than 21 years of age. What are the signs or symptoms? A cough. A fever. Sweating or chills. Chest pain, often when you breathe  deeply or cough. Breathing problems, such as: Fast breathing. Trouble breathing. Shortness of breath. Feeling tired (fatigued). Muscle aches. How is this treated? Treatment for this condition depends on many things, such as: The cause of your illness. Your medicines. Your other health problems. Most adults can be treated at home. Sometimes, treatment must happen in a hospital. Treatment may include medicines to kill germs. Medicines may depend on which germ caused your illness. Very bad pneumonia is rare. If you get it, you may: Have a machine to help you breathe. Have fluid taken away from around your lungs. Follow these instructions at home: Medicines Take over-the-counter and prescription medicines only as told by your doctor. Take cough medicine only if you are losing sleep. Cough medicine can keep your body from taking mucus away from your lungs. If you were prescribed antibiotics, take them as told by your doctor. Do not stop taking them even if you start to feel better. Lifestyle     Do not smoke or use any products that contain nicotine or tobacco. If you need help quitting, ask your doctor. Do not drink alcohol. Eat a healthy diet. This includes a lot of vegetables, fruits, whole grains, low-fat dairy products, and low-fat (lean) protein. General instructions  Rest a lot. Sleep for at least 8 hours each night. Sleep with your head and neck raised. Put a few pillows under your head or sleep in a reclining chair. Return to your normal activities as told by your doctor. Ask your doctor what activities are safe for you. Drink enough fluid to keep your pee (urine) pale yellow. If your throat is sore, gargle with a mixture of salt and water 3-4 times a day or as needed. To make salt water, completely dissolve -1 tsp (3-6 g) of salt in 1 cup (237 mL) of warm water. Keep all follow-up visits. How is this prevented? Getting the pneumonia shot (vaccine). These shots have  different types and schedules. Ask your doctor what works best for you. Think about getting this shot if: You are older than 21 years of age. You are 74-32 years of age and: You are being treated for cancer. You have long-term lung disease. You have other problems that affect your body's defense system. Ask your doctor if you have one of these. Getting your flu shot every year. Ask your doctor which type of shot is best for you. Going to the dentist as often as told. Washing your hands often with soap and water for at least 20 seconds. If you cannot use soap and water, use hand sanitizer. Contact a doctor if: You have a fever. You lose sleep because your cough medicine does not help. Get help right away if: You are short of breath and this gets worse. You have more chest pain. Your sickness gets worse. This is very serious if: You are an  older adult. Your body's defense system is weak. You cough up blood. These symptoms may be an emergency. Get help right away. Call 911. Do not wait to see if the symptoms will go away. Do not drive yourself to the hospital. Summary Pneumonia is an infection of the lungs. Community-acquired pneumonia affects people who have not been in the hospital. Certain germs can cause this infection. This condition may be treated with medicines that kill germs. For very bad pneumonia, you may need a hospital stay and treatment to help with breathing. This information is not intended to replace advice given to you by your health care provider. Make sure you discuss any questions you have with your health care provider. Document Revised: 03/12/2021 Document Reviewed: 03/12/2021 Elsevier Patient Education  [place].    If you have been instructed to have an in-person evaluation today at a local Urgent Care facility, please use the link below. It will take you to a list of all of our available [facility], including address, phone number and  hours of operation. Please do not delay care.  [facility]  If you or a family member do not have a primary care provider, use the link below to schedule a visit and establish care. When you choose a [facility] primary care physician or advanced practice provider, you gain a long-term partner in health. Find a Primary Care Provider  Learn more about [facility] in-office and virtual care options: [facility]

## 2021-11-06 NOTE — Progress Notes (Signed)
Virtual Visit Consent   Erin Montgomery, you are scheduled for a virtual visit with a [facility] provider today. Just as with appointments in the office, your consent must be obtained to participate. Your consent will be active for this visit and any virtual visit you may have with one of our providers in the next 365 days. If you have a MyChart account, a copy of this consent can be sent to you electronically.  As this is a virtual visit, video technology does not allow for your provider to perform a traditional examination. This may limit your provider's ability to fully assess your condition. If your provider identifies any concerns that need to be evaluated in person or the need to arrange testing (such as labs, EKG, etc.), we will make arrangements to do so. Although advances in technology are sophisticated, we cannot ensure that it will always work on either your end or our end. If the connection with a video visit is poor, the visit may have to be switched to a telephone visit. With either a video or telephone visit, we are not always able to ensure that we have a secure connection.  By engaging in this virtual visit, you consent to the provision of healthcare and authorize for your insurance to be billed (if applicable) for the services provided during this visit. Depending on your insurance coverage, you may receive a charge related to this service.  I need to obtain your verbal consent now. Are you willing to proceed with your visit today? Erin Montgomery has provided verbal consent on 11/06/2021 for a virtual visit (video or telephone). Mar Daring, PA-C  Date: 11/06/2021 4:27 PM  Virtual Visit via Video Note   I, Mar Daring, connected with  Erin Montgomery  ([id number], 12-Mar-2000) on 11/06/21 at  4:30 PM EDT by a video-enabled telemedicine application and verified that I am speaking with the correct person using two identifiers.  Location: Patient: Virtual Visit Location  Patient: Home Provider: Virtual Visit Location Provider: Home Office   I discussed the limitations of evaluation and management by telemedicine and the availability of in person appointments. The patient expressed understanding and agreed to proceed.    History of Present Illness: Erin Montgomery is a 21 y.o. who identifies as a nonbinary who was assigned adult at birth, and is being seen today for continued cough. Was seen Virt[facility] on 10/31/21 and diagnosed with possible bacterial bronchitis. Treated with Z-pack, prednisone 6 day taper, albuterol inhaler, and Bromfed DM. Did have improvement in symptoms but went to rugby practice and feels may have pushed too hard too soon. Has not used Bromfed DM cough syrup much since this did cause nightmares. The day after rugby practice the cough returned and is a deep, harsh, barking cough. Having some mild chest pain. Shortness of breath only during coughing spells. Does report having metallic taste in throat with cough. Cough is dry. No new fevers, chills.  Problems:  Patient Active Problem List   Diagnosis Date Noted   Other fatigue 08/05/2021   Moderate episode of recurrent major depressive disorder ([place]) 08/05/2021   Vitamin D deficiency 08/05/2021   Abnormal weight gain 08/05/2021   Body mass index (BMI) of 39.0-39.9 in adult 02/02/2021   Encounter for tubal ligation counseling 07/29/2020   Elevated prolactin level 07/07/2020   Lazy eye of left side 07/07/2020   Encounter to establish care 06/07/2020   Factor V Leiden ([place]) 06/07/2020   History of DVT  in adulthood 06/07/2020   Amenorrhea 06/07/2020   Generalized anxiety disorder 06/07/2020   Attention deficit hyperactivity disorder (ADHD) 06/07/2020   History of suicide attempt 06/07/2020    Allergies: No Known Allergies Medications:  Current Outpatient Medications:    benzonatate (TESSALON) 100 MG capsule, Take 1 capsule (100 mg total) by mouth 3 (three) times daily as needed., Disp: 30  capsule, Rfl: 0   levofloxacin (LEVAQUIN) 500 MG tablet, Take 1 tablet (500 mg total) by mouth daily for 7 days., Disp: 7 tablet, Rfl: 0   predniSONE (DELTASONE) 20 MG tablet, Take 2 tablets (40 mg total) by mouth daily with breakfast., Disp: 10 tablet, Rfl: 0   albuterol (VENTOLIN HFA) 108 (90 Base) MCG/ACT inhaler, Inhale 1-2 puffs into the lungs every 6 (six) hours as needed for wheezing or shortness of breath., Disp: 8 g, Rfl: 0   brompheniramine-pseudoephedrine-DM 30-2-10 MG/5ML syrup, Take 5 mLs by mouth 4 (four) times daily as needed., Disp: 120 mL, Rfl: 0   ergocalciferol (DRISDOL) 1.25 MG (50000 UT) capsule, Take 1 capsule (50,000 Units total) by mouth once a week., Disp: 4 capsule, Rfl: 5   MODERNA COVID-19 BIVALENT 50 MCG/0.5ML injection, , Disp: , Rfl:   Observations/Objective: Patient is well-developed, well-nourished in no acute distress.  Resting comfortably at home.  Head is normocephalic, atraumatic.  No labored breathing.  Speech is clear and coherent with logical content.  Patient is alert and oriented at baseline.  Dry, deep, barking cough heard frequently through call but not affecting speech  Assessment and Plan: 1. Acute bacterial bronchitis - levofloxacin (LEVAQUIN) 500 MG tablet; Take 1 tablet (500 mg total) by mouth daily for 7 days.  Dispense: 7 tablet; Refill: 0 - predniSONE (DELTASONE) 20 MG tablet; Take 2 tablets (40 mg total) by mouth daily with breakfast.  Dispense: 10 tablet; Refill: 0 - benzonatate (TESSALON) 100 MG capsule; Take 1 capsule (100 mg total) by mouth 3 (three) times daily as needed.  Dispense: 30 capsule; Refill: 0  - Worsening after improvements following previous antibiotic treatment - Question progression to pneumonia due to cough characteristics - Will prescribe Levaquin for pneumonia coverage and add prednisone burst - Tessalon perles for cough - Push fluids - Monitor for worsening hemoptysis and seek immediate evaluation if having more  blood with coughing or if shortness of breath worsens - Seek in person evaluation if not improving or if worsening  Follow Up Instructions: I discussed the assessment and treatment plan with the patient. The patient was provided an opportunity to ask questions and all were answered. The patient agreed with the plan and demonstrated an understanding of the instructions.  A copy of instructions were sent to the patient via MyChart unless otherwise noted below.    The patient was advised to call back or seek an in-person evaluation if the symptoms worsen or if the condition fails to improve as anticipated.  Time:  I spent 12 minutes with the patient via telehealth technology discussing the above problems/concerns.    Mar Daring, PA-C

## 2021-11-10 DIAGNOSIS — R002 Palpitations: Secondary | ICD-10-CM | POA: Insufficient documentation

## 2021-11-10 DIAGNOSIS — G90A Postural orthostatic tachycardia syndrome (POTS): Secondary | ICD-10-CM | POA: Insufficient documentation

## 2021-11-17 ENCOUNTER — Telehealth: Payer: Commercial Managed Care - PPO | Admitting: Physician Assistant

## 2021-11-17 ENCOUNTER — Ambulatory Visit (INDEPENDENT_AMBULATORY_CARE_PROVIDER_SITE_OTHER): Payer: Commercial Managed Care - PPO

## 2021-11-17 ENCOUNTER — Ambulatory Visit
Admission: EM | Admit: 2021-11-17 | Discharge: 2021-11-17 | Disposition: A | Discharge status: Home / Self Care | Payer: Commercial Managed Care - PPO | Attending: Urgent Care | Admitting: Urgent Care

## 2021-11-17 DIAGNOSIS — R059 Cough, unspecified: Secondary | ICD-10-CM | POA: Diagnosis not present

## 2021-11-17 DIAGNOSIS — J9801 Acute bronchospasm: Secondary | ICD-10-CM

## 2021-11-17 DIAGNOSIS — R053 Chronic cough: Secondary | ICD-10-CM

## 2021-11-17 MED ORDER — BECLOMETHASONE DIPROP HFA 40 MCG/ACT IN AERB: 2.0000 | INHALATION_SPRAY | Freq: Two times a day (BID) | RESPIRATORY_TRACT | 0 refills | Status: DC

## 2021-11-17 MED ORDER — BENZONATATE 100 MG PO CAPS: 100.0000 mg | ORAL_CAPSULE | Freq: Three times a day (TID) | ORAL | 0 refills | Status: DC | PRN

## 2021-11-17 MED ORDER — TRIAMCINOLONE ACETONIDE 40 MG/ML IJ SUSP
40.0000 mg | Freq: Once | INTRAMUSCULAR | Status: AC
  Administered 2021-11-17: 40 mg via INTRAMUSCULAR

## 2021-11-17 MED ORDER — PULMICORT FLEXHALER 90 MCG/ACT IN AEPB: 1.0000 | INHALATION_SPRAY | Freq: Two times a day (BID) | RESPIRATORY_TRACT | 0 refills | Status: DC

## 2021-11-17 NOTE — Addendum Note (Signed)
Addended by: Brunetta Jeans on: 11/17/2021 02:40 PM   Modules accepted: Orders

## 2021-11-17 NOTE — Patient Instructions (Signed)
  Kirtland Bouchard, thank you for joining Leeanne Rio, PA-C for today's virtual visit.  While this provider is not your primary care provider (PCP), if your PCP is located in our provider database this encounter information will be shared with them immediately following your visit.   [facility] account gives you access to today's visit and all your visits, tests, and labs performed at [facility] " click here if you don't have a [facility] account or go to [email or web address]  Consent: (Patient) Erin Montgomery provided verbal consent for this virtual visit at the beginning of the encounter.  Current Medications:  Current Outpatient Medications:    albuterol (VENTOLIN HFA) 108 (90 Base) MCG/ACT inhaler, Inhale 1-2 puffs into the lungs every 6 (six) hours as needed for wheezing or shortness of breath., Disp: 8 g, Rfl: 0   benzonatate (TESSALON) 100 MG capsule, Take 1 capsule (100 mg total) by mouth 3 (three) times daily as needed., Disp: 30 capsule, Rfl: 0   brompheniramine-pseudoephedrine-DM 30-2-10 MG/5ML syrup, Take 5 mLs by mouth 4 (four) times daily as needed., Disp: 120 mL, Rfl: 0   ergocalciferol (DRISDOL) 1.25 MG (50000 UT) capsule, Take 1 capsule (50,000 Units total) by mouth once a week., Disp: 4 capsule, Rfl: 5   MODERNA COVID-19 BIVALENT 50 MCG/0.5ML injection, , Disp: , Rfl:    predniSONE (DELTASONE) 20 MG tablet, Take 2 tablets (40 mg total) by mouth daily with breakfast., Disp: 10 tablet, Rfl: 0   Medications ordered in this encounter:  No orders of the defined types were placed in this encounter.    *If you need refills on other medications prior to your next appointment, please contact your pharmacy*  Follow-Up: Call back or seek an in-person evaluation if the symptoms worsen or if the condition fails to improve as anticipated.  [facility] [phone]  Other Instructions Please continue use of Tessalon. Add on the  Qvar inhaler. Call your PCP office to schedule in person evaluation. If they cannot see you in next few days, you should use link below to get scheduled at one of our in-person urgent care locations.   If you have been instructed to have an in-person evaluation today at a local Urgent Care facility, please use the link below. It will take you to a list of all of our available [facility], including address, phone number and hours of operation. Please do not delay care.  [facility]  If you or a family member do not have a primary care provider, use the link below to schedule a visit and establish care. When you choose a [facility] primary care physician or advanced practice provider, you gain a long-term partner in health. Find a Primary Care Provider  Learn more about [facility]'s in-office and virtual care options: [facility]

## 2021-11-17 NOTE — ED Provider Notes (Signed)
[facility] - URGENT CARE CENTER  Note:  This document was prepared using Systems analyst and may include unintentional dictation errors.  MRN: [id number] DOB: Oct 25, 2000  Subjective:   Erin Montgomery is a 21 y.o. adult presenting for 1 month history of persistent coughing.  This would be her third visit, has had 2 video visits.  Patient has undergone 2 rounds of antibiotics including azithromycin initially followed by levofloxacin.  She is also undergone steroid use, use cough syrup that she did not do well with and does not want another kind of cough syrup.  She did well with benzonatate.  Today patient was prescribed a steroid inhaler.  She does report that with the oral steroids they only worked while she was on them as her symptoms quickly returned when she completed the course.  No sinus symptoms, throat pain.  No current facility-administered medications for this encounter.  Current Outpatient Medications:    albuterol (VENTOLIN HFA) 108 (90 Base) MCG/ACT inhaler, Inhale 1-2 puffs into the lungs every 6 (six) hours as needed for wheezing or shortness of breath., Disp: 8 g, Rfl: 0   benzonatate (TESSALON) 100 MG capsule, Take 1 capsule (100 mg total) by mouth 3 (three) times daily as needed for cough., Disp: 30 capsule, Rfl: 0   Budesonide (PULMICORT FLEXHALER) 90 MCG/ACT inhaler, Inhale 1 puff into the lungs 2 (two) times daily., Disp: 1 each, Rfl: 0   ergocalciferol (DRISDOL) 1.25 MG (50000 UT) capsule, Take 1 capsule (50,000 Units total) by mouth once a week., Disp: 4 capsule, Rfl: 5   MODERNA COVID-19 BIVALENT 50 MCG/0.5ML injection, , Disp: , Rfl:    No Known Allergies  Past Medical History:  Diagnosis Date   Anxiety    Clotting disorder ([place])    Factor V Leiden   Depression    Dysmenorrhea     Past Surgical History:  Procedure Laterality Date   broken ankle Right    EYE SURGERY     x5   Family History  Problem Relation Age of Onset   Hypertension  Mother    Alcoholism Mother    Heart attack Mother    Depression Mother    High blood pressure Mother    High Cholesterol Mother    High blood pressure Father    High Cholesterol Father    Diabetes Maternal Aunt    Cancer Maternal Grandmother        colon cancer   Heart attack Maternal Grandmother    Heart attack Maternal Grandfather    Depression Maternal Grandfather    Ovarian cancer Paternal Grandmother    Breast cancer Paternal Grandmother    Alcoholism Paternal Grandmother     Social History   Tobacco Use   Smoking status: Never   Smokeless tobacco: Never  Vaping Use   Vaping Use: Never used  Substance Use Topics   Alcohol use: Yes    Comment: 2 drinks/month   Drug use: Never    ROS   Objective:   Vitals: BP (!) 142/89 (BP Location: Left Arm)   Pulse (!) 104   Temp 99.5 F (37.5 C) (Oral)   Resp 16   LMP 10/27/2021 (Exact Date)   SpO2 99%   Physical Exam Constitutional:      General: Erin Bouchard "KJ" is not in acute distress.    Appearance: Normal appearance. Erin LINEBAUGH "KJ" is well-developed. Erin ESQUEDA "KJ" is obese. Erin SHARPE "KJ" is not ill-appearing, toxic-appearing or  diaphoretic.  HENT:     Head: Normocephalic and atraumatic.     Right Ear: External ear normal.     Left Ear: External ear normal.     Nose: Nose normal.     Mouth/Throat:     Mouth: Mucous membranes are moist.  Eyes:     General: No scleral icterus.       Right eye: No discharge.        Left eye: No discharge.     Extraocular Movements: Extraocular movements intact.  Cardiovascular:     Rate and Rhythm: Normal rate and regular rhythm.     Heart sounds: Normal heart sounds. No murmur heard.    No friction rub. No gallop.  Pulmonary:     Effort: Pulmonary effort is normal. No respiratory distress.     Breath sounds: Normal breath sounds. No stridor. No wheezing, rhonchi or rales.  Neurological:     Mental Status: Erin WIRKKALA "KJ" is alert and oriented to  person, place, and time.  Psychiatric:        Mood and Affect: Mood normal.        Behavior: Behavior normal.        Thought Content: Thought content normal.    DG Chest 2 View  Result Date: 11/17/2021 CLINICAL DATA:  Cough for 1 month EXAM: CHEST - 2 VIEW COMPARISON:  None Available. FINDINGS: The lungs appear clear. Cardiac and mediastinal contours normal. No pleural effusion identified. No significant bony abnormality. IMPRESSION: No significant abnormality identified. Electronically Signed   By: Van Clines M.D.   On: 11/17/2021 18:46    Assessment and Plan :   PDMP not reviewed this encounter.  1. Bronchospasm   2. Persistent cough     Discussed antibiotic stewardship.  At this stage I would not recommend more antibiotics.  Patient declined another cough syrup.  We discussed the use of oral versus injectable steroids and as she has failed treatment with oral steroids, recommended injectable.  She was given 40 mg IM triamcinolone in clinic.  She can continue to use albuterol and the Qvar that was prescribed to her today.  I refilled her cough capsules and increase the dosing. Counseled patient on potential for adverse effects with medications prescribed/recommended today, ER and return-to-clinic precautions discussed, patient verbalized understanding.    Jaynee Eagles, [place] 11/17/21 1922

## 2021-11-17 NOTE — ED Triage Notes (Signed)
Patient presents to UC for cough x 1 month. Was dx with bronchitis, treated with antibiotics, albuterol, cough meds. Had a virtual visit instructed to come in for eval since mom who is a nurse concerned with lung sounds.   Denies fever.

## 2021-11-17 NOTE — Progress Notes (Signed)
Virtual Visit Consent   Erin Montgomery, you are scheduled for a virtual visit with a [facility] provider today. Just as with appointments in the office, your consent must be obtained to participate. Your consent will be active for this visit and any virtual visit you may have with one of our providers in the next 365 days. If you have a MyChart account, a copy of this consent can be sent to you electronically.  As this is a virtual visit, video technology does not allow for your provider to perform a traditional examination. This may limit your provider's ability to fully assess your condition. If your provider identifies any concerns that need to be evaluated in person or the need to arrange testing (such as labs, EKG, etc.), we will make arrangements to do so. Although advances in technology are sophisticated, we cannot ensure that it will always work on either your end or our end. If the connection with a video visit is poor, the visit may have to be switched to a telephone visit. With either a video or telephone visit, we are not always able to ensure that we have a secure connection.  By engaging in this virtual visit, you consent to the provision of healthcare and authorize for your insurance to be billed (if applicable) for the services provided during this visit. Depending on your insurance coverage, you may receive a charge related to this service.  I need to obtain your verbal consent now. Are you willing to proceed with your visit today? Erin Montgomery has provided verbal consent on 11/17/2021 for a virtual visit (video or telephone). Erin Montgomery, [place]  Date: 11/17/2021 12:39 PM  Virtual Visit via Video Note   I, Erin Montgomery, connected with  Erin Montgomery  ([id number], 01/10/2001) on 11/17/21 at 12:30 PM EDT by a video-enabled telemedicine application and verified that I am speaking with the correct person using two identifiers.  Location: Patient: Virtual Visit Location  Patient: Home Provider: Virtual Visit Location Provider: Home Office   I discussed the limitations of evaluation and management by telemedicine and the availability of in person appointments. The patient expressed understanding and agreed to proceed.    History of Present Illness: Erin Montgomery is a 21 y.o. who identifies as a nonbinary who was assigned adult at birth, and is being seen today for some continued cough after recent bronchitis.  Notes initially being placed on Azithromycin and a steroid pack. Symptoms improved but shortly after recurred at which time they were evaluated again via video visit and started on a 5-day steroid burst, Levaquin, albuterol and Tessalon. Endorses completing course of medication and feeling much better but still having substantial cough that is now dry but persisting and making them gag at times. Is using the Albuterol inhaler given with some improvement in symptoms but not substantial enough. Notes some fatigue. Mother is an Therapist, sports and listened to their lungs and noted being worried about the cough. They have not reached out to their PCP as of yet.    HPI: HPI  Problems:  Patient Active Problem List   Diagnosis Date Noted   Palpitation 11/10/2021   Postural orthostatic tachycardia syndrome 11/10/2021   Other fatigue 08/05/2021   Moderate episode of recurrent major depressive disorder ([place]) 08/05/2021   Vitamin D deficiency 08/05/2021   Abnormal weight gain 08/05/2021   Body mass index (BMI) of 39.0-39.9 in adult 02/02/2021   Encounter for tubal ligation counseling 07/29/2020   Elevated  prolactin level 07/07/2020   Lazy eye of left side 07/07/2020   Encounter to establish care 06/07/2020   Factor V Leiden ([place]) 06/07/2020   History of DVT in adulthood 06/07/2020   Amenorrhea 06/07/2020   Generalized anxiety disorder 06/07/2020   Attention deficit hyperactivity disorder (ADHD) 06/07/2020   History of suicide attempt 06/07/2020    Allergies: No Known  Allergies Medications:  Current Outpatient Medications:    beclomethasone (QVAR) 40 MCG/ACT inhaler, Inhale 2 puffs into the lungs 2 (two) times daily., Disp: 1 each, Rfl: 0   benzonatate (TESSALON) 100 MG capsule, Take 1 capsule (100 mg total) by mouth 3 (three) times daily as needed for cough., Disp: 30 capsule, Rfl: 0   albuterol (VENTOLIN HFA) 108 (90 Base) MCG/ACT inhaler, Inhale 1-2 puffs into the lungs every 6 (six) hours as needed for wheezing or shortness of breath., Disp: 8 g, Rfl: 0   ergocalciferol (DRISDOL) 1.25 MG (50000 UT) capsule, Take 1 capsule (50,000 Units total) by mouth once a week., Disp: 4 capsule, Rfl: 5   MODERNA COVID-19 BIVALENT 50 MCG/0.5ML injection, , Disp: , Rfl:   Observations/Objective: Patient is well-developed, well-nourished in no acute distress.  Resting comfortably at home.  Head is normocephalic, atraumatic.  No labored breathing. Speech is clear and coherent with logical content.  Patient is alert and oriented at baseline.   Assessment and Plan: 1. Bronchospasm - beclomethasone (QVAR) 40 MCG/ACT inhaler; Inhale 2 puffs into the lungs 2 (two) times daily.  Dispense: 1 each; Refill: 0 - benzonatate (TESSALON) 100 MG capsule; Take 1 capsule (100 mg total) by mouth 3 (three) times daily as needed for cough.  Dispense: 30 capsule; Refill: 0  Likely postinfectious bronchospasm versus recurrence of bronchitis. They need an in-person evaluation and agrees to reach out to Their PCP office directly after video ends for further evaluation. In the meantime, will refill Tessalon. Will also start on Qvar to be used in combination with their albuterol UC/ER precautions reviewed.   Follow Up Instructions: I discussed the assessment and treatment plan with the patient. The patient was provided an opportunity to ask questions and all were answered. The patient agreed with the plan and demonstrated an understanding of the instructions.  A copy of instructions were sent  to the patient via MyChart unless otherwise noted below.    The patient was advised to call back or seek an in-person evaluation if the symptoms worsen or if the condition fails to improve as anticipated.  Time:  I spent 10 minutes with the patient via telehealth technology discussing the above problems/concerns.    Erin Rio, PA-C

## 2021-11-19 ENCOUNTER — Ambulatory Visit: Payer: Commercial Managed Care - PPO | Admitting: Physician Assistant

## 2021-11-30 ENCOUNTER — Encounter: Payer: Self-pay | Admitting: Nurse Practitioner

## 2021-12-01 ENCOUNTER — Other Ambulatory Visit: Payer: Self-pay | Admitting: Nurse Practitioner

## 2021-12-01 DIAGNOSIS — H538 Other visual disturbances: Secondary | ICD-10-CM

## 2021-12-01 DIAGNOSIS — H53003 Unspecified amblyopia, bilateral: Secondary | ICD-10-CM

## 2021-12-01 NOTE — Progress Notes (Signed)
New referral to ophthalmology made today. One was made in 02/2021, however, patient was never contacted regarding appointment.

## 2021-12-08 NOTE — Telephone Encounter (Signed)
Pt called back and said that Coralie Keens is scheduling out till [place] and wanted to know if she could be referred to someone else. Please advise. Felicite Zeimet Zimmerman Rumple, CMA

## 2021-12-08 NOTE — Telephone Encounter (Signed)
I honestly don't believe we are going to be able to get anything earlier than that. Specialist appointments all seem to be months out.

## 2021-12-08 NOTE — Telephone Encounter (Signed)
Contacted patient and informed her of the office name and number that her referral was sent to.  Deshanti Adcox Zimmerman Rumple, CMA

## 2021-12-08 NOTE — Telephone Encounter (Signed)
Hey. Can you check on this referral for me? Please and thank you .

## 2021-12-10 ENCOUNTER — Encounter: Payer: Self-pay | Admitting: Nurse Practitioner

## 2021-12-10 NOTE — Telephone Encounter (Signed)
Contacted pt and informed her of below. She appreciated the call. Marc Leichter Zimmerman Rumple, CMA

## 2021-12-15 ENCOUNTER — Other Ambulatory Visit: Payer: Self-pay | Admitting: Nurse Practitioner

## 2021-12-15 DIAGNOSIS — F411 Generalized anxiety disorder: Secondary | ICD-10-CM

## 2021-12-15 NOTE — Progress Notes (Signed)
Referral placed to psychiatry for worsening generalized anxiety disorder.

## 2021-12-16 ENCOUNTER — Other Ambulatory Visit: Payer: Self-pay | Admitting: Nurse Practitioner

## 2021-12-16 ENCOUNTER — Other Ambulatory Visit (HOSPITAL_COMMUNITY): Payer: Self-pay

## 2021-12-16 DIAGNOSIS — F411 Generalized anxiety disorder: Secondary | ICD-10-CM

## 2021-12-16 DIAGNOSIS — F331 Major depressive disorder, recurrent, moderate: Secondary | ICD-10-CM

## 2021-12-16 MED ORDER — BUPROPION HCL ER (XL) 150 MG PO TB24
150.0000 mg | ORAL_TABLET | Freq: Every day | ORAL | 0 refills | Status: DC
  Filled 2021-12-16: qty 90, 90d supply, fill #0

## 2021-12-16 NOTE — Progress Notes (Signed)
Started Wellbutrin XL 150 mg daily. Sent to [facility]. Referred to Dr. Erling Cruz, psychiatry, in [facility].

## 2022-01-14 ENCOUNTER — Other Ambulatory Visit: Payer: Self-pay | Admitting: Nurse Practitioner

## 2022-01-14 DIAGNOSIS — E559 Vitamin D deficiency, unspecified: Secondary | ICD-10-CM

## 2022-02-04 NOTE — Progress Notes (Unsigned)
Established patient visit   Patient: Erin Montgomery   DOB: May 07, 2000   21 y.o. Adult  MRN: [id number] Visit Date: 02/05/2022   No chief complaint on file.  Subjective    HPI  Follow up -mood --taking wellbutrin XL daily  --has been referred to counseling services    Medications: Outpatient Medications Prior to Visit  Medication Sig   buPROPion (WELLBUTRIN XL) 150 MG 24 hr tablet Take 1 tablet (150 mg total) by mouth daily.   albuterol (VENTOLIN HFA) 108 (90 Base) MCG/ACT inhaler Inhale 1-2 puffs into the lungs every 6 (six) hours as needed for wheezing or shortness of breath.   benzonatate (TESSALON) 100 MG capsule Take 1-2 capsules (100-200 mg total) by mouth 3 (three) times daily as needed for cough.   Budesonide (PULMICORT FLEXHALER) 90 MCG/ACT inhaler Inhale 1 puff into the lungs 2 (two) times daily.   MODERNA COVID-19 BIVALENT 50 MCG/0.5ML injection    Vitamin D, Ergocalciferol, (DRISDOL) 1.25 MG (50000 UNIT) CAPS capsule TAKE 1 CAPSULE BY MOUTH ONE TIME PER WEEK   No facility-administered medications prior to visit.    Review of Systems  {Labs (Optional):23779}   Objective    There were no vitals filed for this visit. There is no height or weight on file to calculate BMI.  BP Readings from Last 3 Encounters:  11/17/21 (Abnormal) 142/89  10/27/21 128/84  08/05/21 114/77    Wt Readings from Last 3 Encounters:  10/27/21 239 lb (108.4 kg)  08/05/21 240 lb 6.4 oz (109 kg)  06/09/21 230 lb 3.2 oz (104.4 kg)    Physical Exam  ***  No results found for any visits on 02/05/22.  Assessment & Plan     Problem List Items Addressed This Visit   None    No follow-ups on file.         Ronnell Freshwater, NP  [facility] [phone] (phone) [phone] (fax)  [place]

## 2022-02-05 ENCOUNTER — Other Ambulatory Visit (HOSPITAL_COMMUNITY): Payer: Self-pay

## 2022-02-05 ENCOUNTER — Ambulatory Visit (INDEPENDENT_AMBULATORY_CARE_PROVIDER_SITE_OTHER): Payer: 59 | Admitting: Nurse Practitioner

## 2022-02-05 ENCOUNTER — Encounter: Payer: Self-pay | Admitting: Nurse Practitioner

## 2022-02-05 VITALS — BP 136/75 | HR 95 | Ht 62.99 in | Wt 245.8 lb

## 2022-02-05 DIAGNOSIS — E559 Vitamin D deficiency, unspecified: Secondary | ICD-10-CM | POA: Diagnosis not present

## 2022-02-05 DIAGNOSIS — W57XXXA Bitten or stung by nonvenomous insect and other nonvenomous arthropods, initial encounter: Secondary | ICD-10-CM | POA: Insufficient documentation

## 2022-02-05 DIAGNOSIS — N912 Amenorrhea, unspecified: Secondary | ICD-10-CM

## 2022-02-05 DIAGNOSIS — F411 Generalized anxiety disorder: Secondary | ICD-10-CM

## 2022-02-05 DIAGNOSIS — Z91018 Allergy to other foods: Secondary | ICD-10-CM

## 2022-02-05 DIAGNOSIS — W57XXXS Bitten or stung by nonvenomous insect and other nonvenomous arthropods, sequela: Secondary | ICD-10-CM

## 2022-02-05 DIAGNOSIS — Z6839 Body mass index (BMI) 39.0-39.9, adult: Secondary | ICD-10-CM | POA: Diagnosis not present

## 2022-02-05 DIAGNOSIS — F331 Major depressive disorder, recurrent, moderate: Secondary | ICD-10-CM

## 2022-02-05 DIAGNOSIS — R5383 Other fatigue: Secondary | ICD-10-CM

## 2022-02-05 DIAGNOSIS — R635 Abnormal weight gain: Secondary | ICD-10-CM

## 2022-02-05 MED ORDER — VITAMIN D (ERGOCALCIFEROL) 1.25 MG (50000 UNIT) PO CAPS
50000.0000 [IU] | ORAL_CAPSULE | ORAL | 1 refills | Status: DC
  Filled 2022-02-05: qty 12, 84d supply, fill #0
  Filled 2022-05-07: qty 12, 84d supply, fill #1

## 2022-02-05 MED ORDER — SERTRALINE HCL 25 MG PO TABS
25.0000 mg | ORAL_TABLET | Freq: Every day | ORAL | 1 refills | Status: DC
  Filled 2022-02-05: qty 90, 90d supply, fill #0
  Filled 2022-05-07: qty 90, 90d supply, fill #1

## 2022-02-09 LAB — COMPREHENSIVE METABOLIC PANEL
ALT: 30 IU/L (ref 0–32)
AST: 21 IU/L (ref 0–40)
Albumin/Globulin Ratio: 1.6 (ref 1.2–2.2)
Albumin: 4.5 g/dL (ref 4.0–5.0)
Alkaline Phosphatase: 86 IU/L (ref 44–121)
BUN/Creatinine Ratio: 13 (ref 9–23)
BUN: 11 mg/dL (ref 6–20)
Bilirubin Total: 0.7 mg/dL (ref 0.0–1.2)
CO2: 21 mmol/L (ref 20–29)
Calcium: 9.6 mg/dL (ref 8.7–10.2)
Chloride: 103 mmol/L (ref 96–106)
Creatinine, Ser: 0.84 mg/dL (ref 0.57–1.00)
Globulin, Total: 2.9 g/dL (ref 1.5–4.5)
Glucose: 87 mg/dL (ref 70–99)
Potassium: 4.2 mmol/L (ref 3.5–5.2)
Sodium: 140 mmol/L (ref 134–144)
Total Protein: 7.4 g/dL (ref 6.0–8.5)
eGFR: 101 mL/min/{1.73_m2} (ref >59)

## 2022-02-09 LAB — CBC
Hematocrit: 43.2 % (ref 34.0–46.6)
Hemoglobin: 14.5 g/dL (ref 11.1–15.9)
MCH: 28.8 pg (ref 26.6–33.0)
MCHC: 33.6 g/dL (ref 31.5–35.7)
MCV: 86 fL (ref 79–97)
Platelets: 301 10*3/uL (ref 150–450)
RBC: 5.03 x10E6/uL (ref 3.77–5.28)
RDW: 13 % (ref 11.7–15.4)
WBC: 6.3 10*3/uL (ref 3.4–10.8)

## 2022-02-09 LAB — TSH+FREE T4
Free T4: 1.27 ng/dL (ref 0.82–1.77)
TSH: 1.96 u[IU]/mL (ref 0.450–4.500)

## 2022-02-09 LAB — ALPHA-GAL PANEL
Allergen Lamb IgE: <0.1 kU/L
Beef IgE: <0.1 kU/L
IgE (Immunoglobulin E), Serum: 77 IU/mL (ref 6–495)
O215-IgE Alpha-Gal: <0.1 kU/L
Pork IgE: <0.1 kU/L

## 2022-02-09 LAB — B12 AND FOLATE PANEL
Folate: 16.1 ng/mL (ref >3.0)
Vitamin B-12: 433 pg/mL (ref 232–1245)

## 2022-02-09 LAB — FERRITIN: Ferritin: 139 ng/mL (ref 15–150)

## 2022-02-09 LAB — HEMOGLOBIN A1C
Est. average glucose Bld gHb Est-mCnc: 82 mg/dL
Hgb A1c MFr Bld: 4.5 % — ABNORMAL LOW (ref 4.8–5.6)

## 2022-02-10 ENCOUNTER — Institutional Professional Consult (permissible substitution): Payer: Commercial Managed Care - PPO | Admitting: Internal Medicine

## 2022-02-12 ENCOUNTER — Ambulatory Visit (INDEPENDENT_AMBULATORY_CARE_PROVIDER_SITE_OTHER): Payer: 59 | Admitting: Behavioral Health

## 2022-02-12 DIAGNOSIS — F411 Generalized anxiety disorder: Secondary | ICD-10-CM

## 2022-02-12 DIAGNOSIS — F32A Depression, unspecified: Secondary | ICD-10-CM
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
## 2022-02-12 NOTE — Progress Notes (Deleted)
      [facility] Counselor/Therapist Progress Note  Patient ID: Erin Montgomery, MRN: [id number],    Date: 02/12/2022  Time Spent: ***   Treatment Type: {CHL AMB THERAPY [phone]}  Reported Symptoms: ***  Mental Status Exam:  Appearance:   {PSY:22683}     Behavior:  {PSY:21022743}  Motor:  {PSY:22302}  Speech/Language:   {PSY:22685}  Affect:  {PSY:22687}  Mood:  {PSY:31886}  Thought process:  {PSY:31888}  Thought content:    {PSY:[phone]}  Sensory/Perceptual disturbances:    {[phone]}  Orientation:  {PSY:30297}  Attention:  {PSY:22877}  Concentration:  {PSY:854-731-6749}  Memory:  {[phone]}  Fund of knowledge:   {PSY:854-731-6749}  Insight:    {PSY:854-731-6749}  Judgment:   {PSY:854-731-6749}  Impulse Control:  {PSY:854-731-6749}   Risk Assessment: Danger to Self:  {PSY:22692} Self-injurious Behavior: {PSY:22692} Danger to Others: {PSY:22692} Duty to Warn:{PSY:311194} Physical Aggression / Violence:{PSY:21197} Access to Firearms a concern: {PSY:21197} Gang Involvement:{PSY:21197}  Subjective: ***   Interventions: {PSY:934-558-5725}  Diagnosis:No diagnosis found.  Plan: ***  Jannifer Hick, [facility]

## 2022-02-15 ENCOUNTER — Encounter: Payer: Self-pay | Admitting: Nurse Practitioner

## 2022-02-16 ENCOUNTER — Encounter: Payer: Self-pay | Admitting: Nurse Practitioner

## 2022-02-16 ENCOUNTER — Other Ambulatory Visit: Payer: Self-pay | Admitting: Nurse Practitioner

## 2022-02-16 ENCOUNTER — Encounter: Payer: Self-pay | Admitting: Behavioral Health

## 2022-02-16 DIAGNOSIS — F411 Generalized anxiety disorder: Secondary | ICD-10-CM

## 2022-02-23 ENCOUNTER — Encounter: Payer: Self-pay | Admitting: Internal Medicine

## 2022-02-23 ENCOUNTER — Ambulatory Visit: Payer: 59 | Attending: Internal Medicine | Admitting: Internal Medicine

## 2022-02-23 VITALS — BP 116/77 | HR 85 | Ht 62.9 in | Wt 240.8 lb

## 2022-02-23 DIAGNOSIS — G90A Postural orthostatic tachycardia syndrome (POTS): Secondary | ICD-10-CM

## 2022-02-23 NOTE — Progress Notes (Signed)
ELECTROPHYSIOLOGY CONSULT NOTE  Patient ID: Erin Montgomery, MRN: [id number], DOB/AGE: [birth year] 22 y.o. Admit date: (Not on file) Date of Consult: 02/23/2022  Primary Physician: Ronnell Freshwater, NP Primary Cardiologist: new     Erin Montgomery is a 22 y.o. adult who is being seen today for the evaluation of POTS at the request of HB.    HPI Erin Montgomery is a 22 y.o. adult who has a longstanding history of dizziness.  It is positional, but it is associated with becoming vertical as well as becoming horizontal, is associated with rotation in bed.  There is some degree of heat intolerance, but she has been able to exercise vigorously.  She is doing [employer] and has heart rates quite appropriate for the level of activity 140s or so with stairs and 160s or so with vigorous exercise.  She does have an episode of presyncope.  Some of her dizzy spells are associated with tunnel vision.  There is some degree of orthostatic intolerance which can be obviated by sitting.  Some shower intolerance, she finds that showers more than 15 to 30 minutes can be difficult to tolerate.  And when she is done with her long shower she feels quite exhausted.  Her episodes of dizziness are frequently associated with quite vigorous nausea.  Pallor is not described.  There was an episode of associated with drinking alcohol where her heart rate went to 160.  No further alcohol.  Denies use of marijuana or CBD.  Heavy periods but has been told she is not anemic: Symptoms are not particularly worse around the time   Date Cr K Hgb  1/24 0.84 4.2 14.5 note limited at         History of being homozygous for factor V Leiden identified in the context of a family history.  Because of this has not been prescribed oral contraceptives.  Has been on anticoagulation sporadically since 2020 following a leg fracture, this was stopped by hematology 5/23 in the absence of a definitive history of antecedent DVT, moreover,  thought she did not have an absolute contraindication to hormonal therapy.  Strongly advised her to refrain from smoking  Past Medical History:  Diagnosis Date   Anxiety    Clotting disorder ([place])    Factor V Leiden   Depression    Dysmenorrhea       Surgical History:  Past Surgical History:  Procedure Laterality Date   broken ankle Right    EYE SURGERY     x5     Home Meds: Current Meds  Medication Sig   buPROPion (WELLBUTRIN XL) 150 MG 24 hr tablet Take 1 tablet (150 mg total) by mouth daily.   MODERNA COVID-19 BIVALENT 50 MCG/0.5ML injection    sertraline (ZOLOFT) 25 MG tablet Take 1 tablet (25 mg total) by mouth daily.   Vitamin D, Ergocalciferol, (DRISDOL) 1.25 MG (50000 UNIT) CAPS capsule Take 1 capsule by mouth every 7 days.    Allergies: No Known Allergies  Social History   Socioeconomic History   Marital status: Single    Spouse name: Not on file   Number of children: Not on file   Years of education: Not on file   Highest education level: Not on file  Occupational History   Not on file  Tobacco Use   Smoking status: Never   Smokeless tobacco: Never  Vaping Use   Vaping Use: Never used  Substance and Sexual Activity  Alcohol use: Yes    Comment: 2 drinks/month   Drug use: Never   Sexual activity: Yes    Birth control/protection: None    Comment: female partner  Other Topics Concern   Not on file  Social History Narrative   Not on file   Social Determinants of Health   Financial Resource Strain: Not on file  Food Insecurity: Not on file  Transportation Needs: Not on file  Physical Activity: Not on file  Stress: Not on file  Social Connections: Not on file  Intimate Partner Violence: Not on file     Family History  Problem Relation Age of Onset   Hypertension Mother    Alcoholism Mother    Heart attack Mother    Depression Mother    High blood pressure Mother    High Cholesterol Mother    High blood pressure Father    High  Cholesterol Father    Diabetes Maternal Aunt    Cancer Maternal Grandmother        colon cancer   Heart attack Maternal Grandmother    Heart attack Maternal Grandfather    Depression Maternal Grandfather    Ovarian cancer Paternal Grandmother    Breast cancer Paternal Grandmother    Alcoholism Paternal Grandmother      ROS:  Please see the history of present illness.     All other systems reviewed and negative.    Physical Exam:  Blood pressure 116/77, pulse 85, height 5' 2.9" (1.598 m), weight 240 lb 12.8 oz (109.2 kg), last menstrual period 02/08/2022, SpO2 99 %. General: Well developed, well nourished adult in no acute distress. obese Head: Normocephalic, atraumatic, sclera non-icteric, no xanthomas, nares are without discharge. EENT: normal no nystagmus Lymph Nodes:  none Neck: Negative for carotid bruits. JVD not elevated. Back:without scoliosis kyphosis Lungs: Clear bilaterally to auscultation without wheezes, rales, or rhonchi. Breathing is unlabored. Heart: RRR with S1 S2. No murmur . No rubs, or gallops appreciated. Abdomen: Soft, non-tender, non-distended with normoactive bowel sounds. No hepatomegaly. No rebound/guarding. No obvious abdominal masses. Msk:  Strength and tone appear normal for age. Extremities: No clubbing or cyanosis. No  edema.  Distal pedal pulses are 2+ and equal bilaterally. Skin: Warm and Dry Neuro: Alert and oriented X 3. CN III-XII intact Grossly normal sensory and motor function .  Finger-nose intact and flap left hand less than normal.  Heel shin on the right side not normal   Psych:  Responds to questions appropriately with a normal affect.        EKG:  sinus @ 73 114/09/36   Assessment and Plan:  Dizziness   Factor V Leiden   The patient has dizziness which is not orthostatic.  The fact that the dizziness is aggravated by lying down, as it was when she laid down in the office and then further when I lowered the head of the bed, with  rotation of her head, accompanied by nausea, I wonder whether it is vertiginous.  Certainly I do not think it is cardiovascular.  There are components of her symptom complex like the heat intolerance i.e. she likes it better when it is cool than when it is warm is suggestive but is discordant from her exercise story over across it which is really extremely impressive.  Prolonged standing is also symptoms suggestive of some degree of orthostatic intolerance.  To the degree that this might be present, but without objective findings today or consistent history, do not have any  specific recommendations apart from pushing fluids and is certainly reasonable to increase salt intake.  I suggested she follow-up with her PCP P and consider otolaryngology or neurological evaluation.  Neuropsych issues may also need to be considered  We are glad to see again as needed     Virl Axe

## 2022-02-23 NOTE — Patient Instructions (Signed)
Medication Instructions:  Your physician recommends that you continue on your current medications as directed. Please refer to the Current Medication list given to you today.  *If you need a refill on your cardiac medications before your next appointment, please call your pharmacy*   Lab Work: None ordered.  If you have labs (blood work) drawn today and your tests are completely normal, you will receive your results only by: [facility] [place] (if you have MyChart) OR A paper copy in the mail If you have any lab test that is abnormal or we need to change your treatment, we will call you to review the results.   Testing/Procedures: None ordered.    Follow-Up: At [facility], you and your health needs are our priority.  As part of our continuing mission to provide you with exceptional heart care, we have created designated Provider Care Teams.  These Care Teams include your primary Cardiologist (physician) and Advanced Practice Providers (APPs -  Physician Assistants and Nurse Practitioners) who all work together to provide you with the care you need, when you need it.  We recommend signing up for the patient portal called "MyChart".  Sign up information is provided on this After Visit Summary.  MyChart is used to connect with patients for Virtual Visits (Telemedicine).  Patients are able to view lab/test results, encounter notes, upcoming appointments, etc.  Non-urgent messages can be sent to your provider as well.   To learn more about what you can do with MyChart, go to [email or web address].    Your next appointment:   Follow up with Dr Caryl Comes as needed.

## 2022-02-24 ENCOUNTER — Encounter: Payer: Self-pay | Admitting: Nurse Practitioner

## 2022-02-24 DIAGNOSIS — F411 Generalized anxiety disorder: Secondary | ICD-10-CM | POA: Diagnosis not present

## 2022-02-26 ENCOUNTER — Ambulatory Visit: Payer: 59 | Admitting: Behavioral Health

## 2022-03-02 ENCOUNTER — Encounter: Payer: Self-pay | Admitting: Nurse Practitioner

## 2022-03-03 DIAGNOSIS — F411 Generalized anxiety disorder: Secondary | ICD-10-CM | POA: Diagnosis not present

## 2022-03-06 NOTE — Telephone Encounter (Signed)
OK to place referral for cardiology as below per patient's request.  Please notate on referral that it is a second opinion.  Will need to refer outside of colon.

## 2022-03-09 ENCOUNTER — Other Ambulatory Visit: Payer: Self-pay

## 2022-03-09 ENCOUNTER — Other Ambulatory Visit (HOSPITAL_COMMUNITY): Payer: Self-pay

## 2022-03-09 ENCOUNTER — Other Ambulatory Visit: Payer: Self-pay | Admitting: Nurse Practitioner

## 2022-03-09 DIAGNOSIS — I499 Cardiac arrhythmia, unspecified: Secondary | ICD-10-CM

## 2022-03-09 DIAGNOSIS — F331 Major depressive disorder, recurrent, moderate: Secondary | ICD-10-CM

## 2022-03-09 MED ORDER — BUPROPION HCL ER (XL) 150 MG PO TB24
150.0000 mg | ORAL_TABLET | Freq: Every day | ORAL | 0 refills | Status: DC
  Filled 2022-03-09: qty 90, 90d supply, fill #0

## 2022-03-09 NOTE — Telephone Encounter (Signed)
L.O.V: 02/05/22  N.O.V: Not scheduled  L.R.F: 12/16/21 Bupropion 90 tab 0 refill

## 2022-03-09 NOTE — Telephone Encounter (Signed)
Referral has been placed to [facility] 03/09/2022

## 2022-03-11 ENCOUNTER — Other Ambulatory Visit (HOSPITAL_COMMUNITY): Payer: Self-pay

## 2022-03-12 ENCOUNTER — Ambulatory Visit: Payer: 59 | Admitting: Behavioral Health

## 2022-03-12 DIAGNOSIS — F411 Generalized anxiety disorder: Secondary | ICD-10-CM | POA: Diagnosis not present

## 2022-03-16 ENCOUNTER — Encounter: Payer: Self-pay | Admitting: Nurse Practitioner

## 2022-03-16 ENCOUNTER — Ambulatory Visit (INDEPENDENT_AMBULATORY_CARE_PROVIDER_SITE_OTHER): Payer: 59 | Admitting: Nurse Practitioner

## 2022-03-16 VITALS — BP 126/74 | HR 67 | Ht 62.9 in | Wt 233.8 lb

## 2022-03-16 DIAGNOSIS — G90A Postural orthostatic tachycardia syndrome (POTS): Secondary | ICD-10-CM | POA: Diagnosis not present

## 2022-03-16 DIAGNOSIS — R002 Palpitations: Secondary | ICD-10-CM | POA: Diagnosis not present

## 2022-03-16 DIAGNOSIS — I499 Cardiac arrhythmia, unspecified: Secondary | ICD-10-CM

## 2022-03-16 DIAGNOSIS — F411 Generalized anxiety disorder: Secondary | ICD-10-CM | POA: Diagnosis not present

## 2022-03-16 DIAGNOSIS — D485 Neoplasm of uncertain behavior of skin: Secondary | ICD-10-CM

## 2022-03-16 NOTE — Progress Notes (Signed)
Established patient visit   Patient: Erin Montgomery   DOB: [birth year]   22 y.o. Adult  MRN: [id number] Visit Date: 03/16/2022   Chief Complaint  Patient presents with   Nevus   Subjective    HPI  Follow up  -mole on left side of the neck. Getting larger. Rough in texture.  -has been present for some time. Noticed it getting larger about 2 to 3 weeks ago -three smaller moles on the face.  --1. Left mouth, just above the upper lip.  --2. Left side of face, just left of the nose --3. Right cheek area.  ---all three have gradually become more raised but are smooth in texture.   Would like to have new referral to cardiology.  -has seen EP specialist for irregular heart rhythm, palpitations, and evaluation for POTS -feels like they were, more or less, dismissed by provider who initially saw them, stating the diagnosis was likely to be vertigo.  -equipment had not been working like it should -complaints were not being taken seriously  -symptoms are still present.    Medications: Outpatient Medications Prior to Visit  Medication Sig   buPROPion (WELLBUTRIN XL) 150 MG 24 hr tablet Take 1 tablet (150 mg total) by mouth daily.   HYDROcodone-acetaminophen (NORCO) 7.5-325 MG tablet    Loteprednol-Tobramycin (ZYLET) 0.5-0.3 % SUSP    MODERNA COVID-19 BIVALENT 50 MCG/0.5ML injection    sertraline (ZOLOFT) 25 MG tablet Take 1 tablet (25 mg total) by mouth daily.   Vitamin D, Ergocalciferol, (DRISDOL) 1.25 MG (50000 UNIT) CAPS capsule Take 1 capsule by mouth every 7 days.   No facility-administered medications prior to visit.    Review of Systems  Constitutional:  Positive for fatigue. Negative for activity change, chills and fever.  HENT:  Negative for congestion, postnasal drip, rhinorrhea, sinus pressure, sinus pain, sneezing and sore throat.   Eyes: Negative.   Respiratory:  Positive for shortness of breath. Negative for cough and wheezing.   Cardiovascular:  Positive for  palpitations. Negative for chest pain.  Gastrointestinal:  Negative for constipation, diarrhea, nausea and vomiting.  Endocrine: Negative for cold intolerance, heat intolerance, polydipsia and polyuria.  Genitourinary:  Negative for dysuria, frequency and urgency.  Musculoskeletal:  Negative for back pain and myalgias.  Skin:  Negative for rash.       Few moles which are noted to be changing in size and height.   Allergic/Immunologic: Negative for environmental allergies.  Neurological:  Positive for dizziness and headaches. Negative for weakness.  Psychiatric/Behavioral:  The patient is nervous/anxious.     Last CBC Lab Results  Component Value Date   WBC 6.3 02/05/2022   HGB 14.5 02/05/2022   HCT 43.2 02/05/2022   MCV 86 02/05/2022   MCH 28.8 02/05/2022   RDW 13.0 02/05/2022   PLT 301 [id number]   Last metabolic panel Lab Results  Component Value Date   GLUCOSE 87 02/05/2022   NA 140 02/05/2022   K 4.2 02/05/2022   CL 103 02/05/2022   CO2 21 02/05/2022   BUN 11 02/05/2022   CREATININE 0.84 02/05/2022   EGFR 101 02/05/2022   CALCIUM 9.6 02/05/2022   PROT 7.4 02/05/2022   ALBUMIN 4.5 02/05/2022   LABGLOB 2.9 02/05/2022   AGRATIO 1.6 02/05/2022   BILITOT 0.7 02/05/2022   ALKPHOS 86 02/05/2022   AST 21 02/05/2022   ALT 30 02/05/2022   Last lipids Lab Results  Component Value Date   CHOL 155 08/05/2021   HDL 40  08/05/2021   LDLCALC 91 08/05/2021   TRIG 135 08/05/2021   CHOLHDL 3.9 08/05/2021   Last hemoglobin A1c Lab Results  Component Value Date   HGBA1C 4.5 (L) 02/05/2022   Last thyroid functions Lab Results  Component Value Date   TSH 1.960 02/05/2022   Last vitamin D Lab Results  Component Value Date   VD25OH 20.2 (L) 08/05/2021   Last vitamin B12 and Folate Lab Results  Component Value Date   VITAMINB12 433 02/05/2022   FOLATE 16.1 02/05/2022       Objective     Today's Vitals   03/16/22 1451  BP: 126/74  Pulse: 67  SpO2: 99%   Weight: 233 lb 12.8 oz (106.1 kg)  Height: 5' 2.9" (1.598 m)   Body mass index is 41.55 kg/m.  BP Readings from Last 3 Encounters:  03/16/22 126/74  02/23/22 116/77  02/05/22 136/75    Wt Readings from Last 3 Encounters:  03/16/22 233 lb 12.8 oz (106.1 kg)  02/23/22 240 lb 12.8 oz (109.2 kg)  02/05/22 245 lb 12.8 oz (111.5 kg)    Physical Exam Vitals and nursing note reviewed.  Constitutional:      Appearance: Normal appearance. [name] is well-developed. [name] is obese.  HENT:     Head: Normocephalic and atraumatic.      Nose: Nose normal.     Mouth/Throat:     Mouth: Mucous membranes are moist.     Pharynx: Oropharynx is clear.  Eyes:     Extraocular Movements: Extraocular movements intact.     Conjunctiva/sclera: Conjunctivae normal.     Pupils: Pupils are equal, round, and reactive to light.  Neck:   Cardiovascular:     Rate and Rhythm: Normal rate and regular rhythm.     Pulses: Normal pulses.     Heart sounds: Normal heart sounds.  Pulmonary:     Effort: Pulmonary effort is normal.     Breath sounds: Normal breath sounds.  Abdominal:     Palpations: Abdomen is soft.  Musculoskeletal:        General: Normal range of motion.     Cervical back: Normal range of motion and neck supple.  Lymphadenopathy:     Cervical: No cervical adenopathy.  Skin:    General: Skin is warm and dry.     Capillary Refill: Capillary refill takes less than 2 seconds.  Neurological:     General: No focal deficit present.     Mental Status: [name] is alert and oriented to person, place, and time.  Psychiatric:        Mood and Affect: Mood normal.        Behavior: Behavior normal.        Thought Content: Thought content normal.        Judgment: Judgment normal.      Assessment & Plan    1. Neoplasm of uncertain behavior of skin of neck Skin lesion becoming larger and rough in contour. Refer to dermatology for further evaluation.  -  Ambulatory referral to Dermatology  2. Neoplasm of uncertain behavior of skin of face Few small, raised, round lesions on the face. Refer to dermatology for further evaluation.  - Ambulatory referral to Dermatology  3. Irregular heart rhythm New cardiology referral made for 2nd opinion regarding possible POTS - Ambulatory referral to Cardiology  4. Postural orthostatic tachycardia syndrome New cardiology referral made for 2nd opinion regarding possible POTS - Ambulatory referral  to Cardiology  5. Palpitation New cardiology referral made for 2nd opinion regarding possible POTS - Ambulatory referral to Cardiology   Problem List Items Addressed This Visit       Cardiovascular and Mediastinum   Postural orthostatic tachycardia syndrome   Relevant Orders   Ambulatory referral to Cardiology     Other   Palpitation   Relevant Orders   Ambulatory referral to Cardiology   Other Visit Diagnoses     Neoplasm of uncertain behavior of skin of neck    -  Primary   Relevant Orders   Ambulatory referral to Dermatology   Neoplasm of uncertain behavior of skin of face       Relevant Orders   Ambulatory referral to Dermatology   Irregular heart rhythm       Relevant Orders   Ambulatory referral to Cardiology        Return for as scheduled.         Ronnell Freshwater, NP  [facility] [phone] (phone) [phone] (fax)  [place]

## 2022-03-24 DIAGNOSIS — F411 Generalized anxiety disorder: Secondary | ICD-10-CM | POA: Diagnosis not present

## 2022-03-26 ENCOUNTER — Ambulatory Visit: Payer: 59 | Admitting: Behavioral Health

## 2022-03-28 ENCOUNTER — Encounter: Payer: Self-pay | Admitting: Nurse Practitioner

## 2022-03-30 ENCOUNTER — Other Ambulatory Visit: Payer: Self-pay | Admitting: Nurse Practitioner

## 2022-03-30 DIAGNOSIS — M064 Inflammatory polyarthropathy: Secondary | ICD-10-CM

## 2022-03-31 DIAGNOSIS — F411 Generalized anxiety disorder: Secondary | ICD-10-CM | POA: Diagnosis not present

## 2022-04-08 DIAGNOSIS — F411 Generalized anxiety disorder: Secondary | ICD-10-CM | POA: Diagnosis not present

## 2022-04-09 ENCOUNTER — Ambulatory Visit: Payer: Commercial Managed Care - PPO | Admitting: Obstetrics and Gynecology

## 2022-04-15 ENCOUNTER — Telehealth: Payer: Self-pay

## 2022-04-15 ENCOUNTER — Other Ambulatory Visit: Payer: Self-pay

## 2022-04-15 DIAGNOSIS — D485 Neoplasm of uncertain behavior of skin: Secondary | ICD-10-CM

## 2022-04-15 NOTE — Telephone Encounter (Signed)
Referred was placed to [facility]

## 2022-04-15 NOTE — Telephone Encounter (Signed)
Pt calling in wanting her DERM referral sent to a new place called [facility].   Pt states they can get her in soon.

## 2022-04-21 NOTE — Progress Notes (Signed)
22 y.o. G0P0000 Single Ashkenazi [place]  female here for annual exam.  Pt requested STD testing.  No partner change.  Does not need pregnancy prevention.  Partner does not have a uterus.  Menses are lighter.  Using menstrual cup, change every 3 hours.  Rash on the inside of her right thigh skin.   Seeing cardiology for tachycardia.  PCP:   Vincent Gros  Patient's last menstrual period was 04/13/2022.     Period Cycle (Days): 28 Period Duration (Days): 4-5 Period Pattern: Regular Menstrual Flow: Moderate Menstrual Control: Other (Comment) (menstraul cup) Dysmenorrhea: (!) Moderate     Sexually active: Yes.    The current method of family planning is female transgender non binary.   Exercising: Yes.     Crossfit, rugby 2-3x a week Smoker:  no  Health Maintenance: Pap:  04/07/21 non-diagnostic History of abnormal Pap:  no MMG:  n/a Colonoscopy:  n/a BMD:   n/a  Result  n/a TDaP:  04/07/21 Gardasil:   yes HIV: neg in the past Hep C: neg in the past Screening Labs:  PCP   reports that [employer] Weitzel "[name]" has never smoked. Biagio Quint. Murph "KJ" has never used smokeless tobacco. Biagio Quint. Washabaugh "KJ" reports current alcohol use. Biagio Quint. Klonowski "KJ" reports that [employer]. Hietpas "KJ" does not use drugs.  Past Medical History:  Diagnosis Date   Anxiety    Clotting disorder    Factor V Leiden   Depression    Dysmenorrhea    Family history of BRCA gene mutation    paternal grandmother    Past Surgical History:  Procedure Laterality Date   broken ankle Right    EYE SURGERY     x5   wisdom teeth removal  10/08/2021    Current Outpatient Medications  Medication Sig Dispense Refill   buPROPion (WELLBUTRIN XL) 150 MG 24 hr tablet Take 1 tablet (150 mg total) by mouth daily. 90 tablet 0   sertraline (ZOLOFT) 25 MG tablet Take 1 tablet (25 mg total) by mouth daily. 90 tablet 1   Vitamin D, Ergocalciferol, (DRISDOL) 1.25 MG (50000 UNIT) CAPS capsule Take 1 capsule by mouth  every 7 days. 12 capsule 1   No current facility-administered medications for this visit.    Family History  Problem Relation Age of Onset   Hypertension Mother    Alcoholism Mother    Heart attack Mother    Depression Mother    High blood pressure Mother    High Cholesterol Mother    High blood pressure Father    High Cholesterol Father    Cancer Maternal Grandmother        colon cancer   Heart attack Maternal Grandmother    Heart attack Maternal Grandfather    Depression Maternal Grandfather    Ovarian cancer Paternal Grandmother    Breast cancer Paternal Grandmother    Alcoholism Paternal Grandmother    Alcoholism Paternal Grandfather    Diabetes Maternal Aunt     Review of Systems  All other systems reviewed and are negative.   Exam:   BP 124/72 (BP Location: Left Arm, Patient Position: Sitting, Cuff Size: Large)   Pulse 93   Ht 5' 3.75" (1.619 m)   Wt 240 lb (108.9 kg)   LMP 04/13/2022   SpO2 95%   BMI 41.52 kg/m     General appearance: alert, cooperative and appears stated age Head: normocephalic, without obvious abnormality, atraumatic Neck: no adenopathy, supple, symmetrical, trachea midline  and thyroid normal to inspection and palpation Lungs: clear to auscultation bilaterally Breasts: normal appearance, no masses or tenderness, No nipple retraction or dimpling, No nipple discharge or bleeding, No axillary adenopathy Heart: regular rate and rhythm Abdomen: pannus, abdomen is soft, non-tender; no masses, no organomegaly Extremities: extremities normal, atraumatic, no cyanosis or edema Skin: skin color, texture, turgor normal. Left inguinal fold with small patch of erythema of skin.  Lymph nodes: cervical, supraclavicular, and axillary nodes normal. Neurologic: grossly normal  Pelvic: External genitalia:  no lesions              No abnormal inguinal nodes palpated.              Urethra:  normal appearing urethra with no masses, tenderness or lesions               Bartholins and Skenes: normal                 Vagina: normal appearing vagina with normal color and discharge, no lesions              Cervix: no lesions              Pap taken: yes Bimanual Exam:  Uterus:  normal size, contour, position, consistency, mobility, non-tender              Adnexa: no mass, fullness, tenderness                Chaperone was present for exam:  Warren LacyEmily F, CMA  Assessment:   Wellness visit with routine gynecologic exam. 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  Factor V Leiden. Elevated prolactin.   Resolved.  Ashkenazi Jewish background.  FH BRCA in paternal grandmother.  Breast and ovarian cancer.  Candida of flexural fold of skin.   Plan: Mammogram screening discussed. Self breast awareness reviewed. Pap and reflex HR HPV testing. Guidelines for Calcium, Vitamin D, regular exercise program including cardiovascular and weight bearing exercise. STD screening.  Referral to genetic counselor.   Rx for Nystatin powder.  Follow up annually and prn.   After visit summary provided.

## 2022-04-22 NOTE — Telephone Encounter (Signed)
Referral sent to [facility]

## 2022-04-27 DIAGNOSIS — H5211 Myopia, right eye: Secondary | ICD-10-CM | POA: Diagnosis not present

## 2022-04-28 DIAGNOSIS — F411 Generalized anxiety disorder: Secondary | ICD-10-CM | POA: Diagnosis not present

## 2022-05-04 DIAGNOSIS — F411 Generalized anxiety disorder: Secondary | ICD-10-CM | POA: Diagnosis not present

## 2022-05-05 ENCOUNTER — Ambulatory Visit (INDEPENDENT_AMBULATORY_CARE_PROVIDER_SITE_OTHER): Payer: 59 | Admitting: Obstetrics and Gynecology

## 2022-05-05 ENCOUNTER — Other Ambulatory Visit (HOSPITAL_COMMUNITY): Payer: Self-pay

## 2022-05-05 ENCOUNTER — Encounter: Payer: Self-pay | Admitting: Obstetrics and Gynecology

## 2022-05-05 ENCOUNTER — Other Ambulatory Visit (HOSPITAL_COMMUNITY)
Admission: RE | Admit: 2022-05-05 | Discharge: 2022-05-05 | Disposition: A | Discharge status: Home / Self Care | Payer: 59 | Source: Ambulatory Visit | Attending: Obstetrics and Gynecology | Admitting: Obstetrics and Gynecology

## 2022-05-05 VITALS — BP 124/72 | HR 93 | Ht 63.75 in | Wt 240.0 lb

## 2022-05-05 DIAGNOSIS — Z124 Encounter for screening for malignant neoplasm of cervix: Secondary | ICD-10-CM | POA: Insufficient documentation

## 2022-05-05 DIAGNOSIS — Z8481 Family history of carrier of genetic disease: Secondary | ICD-10-CM

## 2022-05-05 DIAGNOSIS — Z114 Encounter for screening for human immunodeficiency virus [HIV]: Secondary | ICD-10-CM | POA: Diagnosis not present

## 2022-05-05 DIAGNOSIS — Z113 Encounter for screening for infections with a predominantly sexual mode of transmission: Secondary | ICD-10-CM | POA: Diagnosis not present

## 2022-05-05 DIAGNOSIS — Z803 Family history of malignant neoplasm of breast: Secondary | ICD-10-CM

## 2022-05-05 DIAGNOSIS — Z1159 Encounter for screening for other viral diseases: Secondary | ICD-10-CM | POA: Diagnosis not present

## 2022-05-05 DIAGNOSIS — Z01419 Encounter for gynecological examination (general) (routine) without abnormal findings: Secondary | ICD-10-CM

## 2022-05-05 DIAGNOSIS — B372 Candidiasis of skin and nail: Secondary | ICD-10-CM

## 2022-05-05 DIAGNOSIS — Z8041 Family history of malignant neoplasm of ovary: Secondary | ICD-10-CM | POA: Diagnosis not present

## 2022-05-05 MED ORDER — NYSTATIN 100000 UNIT/GM EX POWD
1.0000 | Freq: Three times a day (TID) | CUTANEOUS | 2 refills | Status: AC
  Filled 2022-05-05: qty 15, 5d supply, fill #0
  Filled 2022-06-09 – 2022-06-17 (×3): qty 15, 5d supply, fill #1
  Filled 2022-10-02: qty 15, 5d supply, fill #2

## 2022-05-05 NOTE — Patient Instructions (Signed)

## 2022-05-06 LAB — CYTOLOGY - PAP
Chlamydia: NEGATIVE
Comment: NEGATIVE
Comment: NEGATIVE
Comment: NORMAL
Diagnosis: NEGATIVE
Neisseria Gonorrhea: NEGATIVE
Trichomonas: NEGATIVE

## 2022-05-06 LAB — HIV ANTIBODY (ROUTINE TESTING W REFLEX): HIV 1&2 Ab, 4th Generation: NONREACTIVE

## 2022-05-06 LAB — HEPATITIS C ANTIBODY: Hepatitis C Ab: NONREACTIVE

## 2022-05-06 LAB — RPR: RPR Ser Ql: NONREACTIVE

## 2022-05-07 ENCOUNTER — Other Ambulatory Visit (HOSPITAL_COMMUNITY): Payer: Self-pay

## 2022-05-07 ENCOUNTER — Other Ambulatory Visit: Payer: Self-pay | Admitting: Nurse Practitioner

## 2022-05-07 ENCOUNTER — Ambulatory Visit: Payer: 59 | Admitting: Nurse Practitioner

## 2022-05-07 DIAGNOSIS — F331 Major depressive disorder, recurrent, moderate: Secondary | ICD-10-CM

## 2022-05-07 MED ORDER — BUPROPION HCL ER (XL) 150 MG PO TB24
150.0000 mg | ORAL_TABLET | Freq: Every day | ORAL | 1 refills | Status: AC
Start: 2022-05-07 — End: ?
  Filled 2022-05-07 – 2022-06-17 (×4): qty 90, 90d supply, fill #0
  Filled 2022-10-02: qty 90, 90d supply, fill #1

## 2022-05-12 ENCOUNTER — Encounter: Payer: Self-pay | Admitting: Nurse Practitioner

## 2022-05-18 DIAGNOSIS — F411 Generalized anxiety disorder: Secondary | ICD-10-CM | POA: Diagnosis not present

## 2022-05-26 DIAGNOSIS — F411 Generalized anxiety disorder: Secondary | ICD-10-CM | POA: Diagnosis not present

## 2022-06-03 ENCOUNTER — Ambulatory Visit: Payer: 59 | Admitting: Orthopaedic Surgery

## 2022-06-09 ENCOUNTER — Other Ambulatory Visit: Payer: Self-pay

## 2022-06-09 ENCOUNTER — Encounter (HOSPITAL_COMMUNITY): Payer: Self-pay

## 2022-06-09 ENCOUNTER — Other Ambulatory Visit (HOSPITAL_COMMUNITY): Payer: Self-pay

## 2022-06-09 DIAGNOSIS — F411 Generalized anxiety disorder: Secondary | ICD-10-CM | POA: Diagnosis not present

## 2022-06-15 ENCOUNTER — Other Ambulatory Visit: Payer: Self-pay

## 2022-06-15 DIAGNOSIS — F411 Generalized anxiety disorder: Secondary | ICD-10-CM | POA: Diagnosis not present

## 2022-06-16 ENCOUNTER — Other Ambulatory Visit: Payer: Self-pay

## 2022-06-16 ENCOUNTER — Other Ambulatory Visit (HOSPITAL_COMMUNITY): Payer: Self-pay

## 2022-06-17 ENCOUNTER — Other Ambulatory Visit (HOSPITAL_COMMUNITY): Payer: Self-pay

## 2022-06-17 ENCOUNTER — Other Ambulatory Visit: Payer: Self-pay

## 2022-06-23 ENCOUNTER — Encounter: Payer: Self-pay | Admitting: Genetic Counselor

## 2022-06-23 ENCOUNTER — Other Ambulatory Visit: Payer: Self-pay | Admitting: Genetic Counselor

## 2022-06-23 ENCOUNTER — Inpatient Hospital Stay: Payer: 59

## 2022-06-23 ENCOUNTER — Inpatient Hospital Stay: Payer: 59 | Attending: Genetic Counselor | Admitting: Genetic Counselor

## 2022-06-23 DIAGNOSIS — Z803 Family history of malignant neoplasm of breast: Secondary | ICD-10-CM

## 2022-06-23 DIAGNOSIS — Z8041 Family history of malignant neoplasm of ovary: Secondary | ICD-10-CM

## 2022-06-23 DIAGNOSIS — Z800 Family history of malignant neoplasm of digestive organs: Secondary | ICD-10-CM

## 2022-06-23 LAB — GENETIC SCREENING ORDER

## 2022-06-23 NOTE — Progress Notes (Signed)
REFERRING PROVIDER: Patton Salles, MD [address] [place],  [place] [address]  PRIMARY PROVIDER:  Carlean Jews, NP  PRIMARY REASON FOR VISIT:  Encounter Diagnoses  Name Primary?   Family history of breast cancer Yes   Family history of ovarian cancer    Family history of colon cancer      HISTORY OF PRESENT ILLNESS:   Erin Montgomery, a 22 y.o. non-binary adult, assigned female at birth, was seen for a [place] cancer genetics consultation at the request of Dr. Edward Jolly due to a family history of breast/ovarian cancer and Ashkenazi Jewish ancestry.  Erin Montgomery presents to clinic today to discuss the possibility of a hereditary predisposition to cancer, to discuss genetic testing, and to further clarify their future cancer risks, as well as potential cancer risks for family members.   Erin Montgomery has no personal history of cancer.    CANCER HISTORY:  Oncology History   No history exists.    RISK FACTORS:  Breat imaging: no Number of breast biopsies: 0. Colonoscopy: no; not examined. Hysterectomy: no.  Ovaries intact: yes.  Menarche was at age 88 or 76.  OCP use for approximately 0 years.  HRT use: 0 years. Dermatology: previously; waiting to get established locally   Past Medical History:  Diagnosis Date   Anxiety    Clotting disorder (HCC)    Factor V Leiden   Depression    Dysmenorrhea     Past Surgical History:  Procedure Laterality Date   broken ankle Right    EYE SURGERY     x5   wisdom teeth removal  10/08/2021     FAMILY HISTORY:  We obtained a detailed, 4-generation family history.  Significant diagnoses are listed below: Family History  Problem Relation Age of Onset   Colon cancer Maternal Grandmother        dx 76s   Ovarian cancer Paternal Grandmother        dx 41s   Breast cancer Paternal Grandmother        dx 23s      Erin Montgomery is unaware of previous family history of genetic testing for hereditary cancer risks. They have  limited information with several relatives. Paternal relatives are of Ashkenazi Jewish descent.  They report ~2% Ashkenazi Jewish ancestry in their mother. There is no known consanguinity.   Other relatives are unavailable for genetic testing at this time.   GENETIC COUNSELING ASSESSMENT: Erin Montgomery has a family history which is somewhat suggestive of a hereditary cancer syndrome given the family history of breast/ovarian cancer and Ashkenazi Jewish ancestry.  We, therefore, discussed and recommended the following at today's visit.   DISCUSSION: We discussed that 5 - 10% of cancer is hereditary, with most cases of hereditary breast/ovarian cancer associated with mutations in BRCA1/2.  There are other genes that can be associated with hereditary breast/ovarian/colon cancer syndromes.  We discussed that testing is beneficial for several reasons, including knowing about other cancer risks, identifying potential screening and risk-reduction options that may be appropriate, and to understanding if other family members could be at risk for cancer and allowing them to undergo genetic testing.  We reviewed the characteristics, features and inheritance patterns of hereditary cancer syndromes. We also discussed genetic testing, including the appropriate family members to test, the process of testing, insurance coverage and turn-around-time for results. We discussed the implications of a negative, positive, and variant of uncertain significant result. We discussed that negative results would be uninformative  given that Erin Montgomery does not have a personal history of cancer. We recommended Erin Montgomery pursue genetic testing for a panel that contains genes associated with breast, ovarian, colon, and other cancers.  The CancerNext-Expanded gene panel offered by [facility] and includes sequencing, rearrangement, and RNA analysis for the following 71 genes:  AIP, ALK, APC, ATM, BAP1, BARD1, BMPR1A, BRCA1, BRCA2, BRIP1, CDC73, CDH1, CDK4,  CDKN1B, CDKN2A, CHEK2, DICER1, FH, FLCN, KIF1B, LZTR1,MAX, MEN1, MET, MLH1, MSH2, MSH6, MUTYH, NF1, NF2, NTHL1, PALB2, PHOX2B, PMS2, POT1, PRKAR1A, PTCH1, PTEN, RAD51C,RAD51D, RB1, RET, SDHA, SDHAF2, SDHB, SDHC, SDHD, SMAD4, SMARCA4, SMARCB1, SMARCE1, STK11, SUFU, TMEM127, TP53,TSC1, TSC2 and VHL (sequencing and deletion/duplication); AXIN2, CTNNA1, EGFR, EGLN1, HOXB13, KIT, MITF, MSH3, PDGFRA, POLD1 and POLE (sequencing only); EPCAM and GREM1 (deletion/duplication only).   Based on Erin Montgomery's family history of breast and ovarian cancer and Ashkenazi Jewish ancestry, they meet medical criteria for genetic testing. Despite that Erin Montgomery meets criteria, they may still have an out of pocket cost. We discussed that if their out of pocket cost for testing is over $100, the laboratory should contact them to discuss self-pay options and/or patient pay assistance programs.   We discussed the Genetic Information Non-Discrimination Act (GINA) of 2008, which helps protect individuals against genetic discrimination based on their genetic test results.  It impacts both health insurance and employment.  With health insurance, it protects against genetic test results being used for increased premiums or policy termination. For employment, it protects against hiring, firing and promoting decisions based on genetic test results.  GINA does not apply to those in the [employer], those who work for companies with less than 15 employees, and new life insurance or long-term disability insurance policies.  Health status due to a cancer diagnosis is not protected under GINA.  PLAN: After considering the risks, benefits, and limitations, Erin Montgomery provided informed consent to pursue genetic testing and the blood sample was sent to [facility] for analysis of the CancerNext-Expanded +RNAinsight Panel. Results should be available within approximately 3 weeks' time, at which point they will be disclosed by telephone to [facility] as will any additional  recommendations warranted by these results. They will receive a summary of their genetic counseling visit and a copy of results once available. This information will also be available in Epic.    Erin Montgomery's questions were answered to their satisfaction today. Our contact information was provided should additional questions or concerns arise. Thank you for the referral and allowing [place] to share in the care of your patient.   Erin Kataoka M. Rennie Plowman, MS, [facility] Genetic Counselor [email or web address] (P) [phone]   The patient was seen for a total of 40 minutes in face-to-face genetic counseling.  The patient was patient was accompanied by their partner, Angus Palms (they/he).  Drs. Pamelia Hoit and/or Mosetta Putt were available to discuss this case as needed.  _______________________________________________________________________ For Office Staff:  Number of people involved in session: 2 Was an Intern/ student involved with case: no

## 2022-06-25 DIAGNOSIS — F411 Generalized anxiety disorder: Secondary | ICD-10-CM | POA: Diagnosis not present

## 2022-06-29 ENCOUNTER — Other Ambulatory Visit: Payer: Self-pay

## 2022-06-29 ENCOUNTER — Other Ambulatory Visit (INDEPENDENT_AMBULATORY_CARE_PROVIDER_SITE_OTHER): Payer: 59

## 2022-06-29 ENCOUNTER — Ambulatory Visit (INDEPENDENT_AMBULATORY_CARE_PROVIDER_SITE_OTHER): Payer: 59 | Admitting: Orthopaedic Surgery

## 2022-06-29 ENCOUNTER — Encounter: Payer: Self-pay | Admitting: Orthopaedic Surgery

## 2022-06-29 VITALS — Ht 63.75 in | Wt 240.0 lb

## 2022-06-29 DIAGNOSIS — M25562 Pain in left knee: Secondary | ICD-10-CM | POA: Diagnosis not present

## 2022-06-29 DIAGNOSIS — G8929 Other chronic pain: Secondary | ICD-10-CM | POA: Diagnosis not present

## 2022-06-29 DIAGNOSIS — M25561 Pain in right knee: Secondary | ICD-10-CM

## 2022-06-29 DIAGNOSIS — M2550 Pain in unspecified joint: Secondary | ICD-10-CM

## 2022-06-29 NOTE — Progress Notes (Signed)
The patient is a 22 year old active individual who does participate in rugby and CrossFit.  She is taken the last 2 weeks off due to aches and pains in multiple joints.  She has bilateral knee pain, bilateral hip pain and bilateral shoulder pain.  She does get popping in her hands and knuckles at times.  She has times where she rests and then her pain goes away completely.  Today she is pain-free.  She does feel though that these multiple joint aches and pains are definitely affecting her mobility and her quality of life.  She likes to stay as active as she can.  She has had right ankle surgery in the past for her fracture.  She has never had surgery on her knees.  She is an active again individual with a BMI 41.52 so she tries to stay as active as she can.  She is on a statin medication and sometimes these can contribute to all joint aches and musculoskeletal type of pain.  I was able to review all of the patient's medications and past medical history.  On exam both shoulders move fluidly and smoothly with 5 out of 5 strength of the rotator cuff of both shoulders.  Both hips move smoothly and fluidly and both knees move smoothly and fluidly and are ligamentously stable.  Of note she did have right knee swelling recently.  Both knees have no effusion today and slightly hyperextend.  X-rays of both knees are basically normal.  She has good joint space medial laterally neutral alignment.  Certainly given her multiple joint aches and pains and complaints, this could be rheumatologic process that is going on and creates synovitis.  Her activities with CrossFit and rugby can also exacerbate some of the pain.  I would like to send her to rheumatology for referral given that she has complaints of more than 7 joints.  Also, could this be an issue with statin medications.  It is worth having the rheumatologic workup given what she is describing.  She agrees with this as well.  Orthopedic follow-up can be as needed.

## 2022-07-06 ENCOUNTER — Ambulatory Visit: Payer: Self-pay | Admitting: Genetic Counselor

## 2022-07-06 ENCOUNTER — Encounter: Payer: Self-pay | Admitting: Genetic Counselor

## 2022-07-06 ENCOUNTER — Telehealth: Payer: Self-pay | Admitting: Genetic Counselor

## 2022-07-06 DIAGNOSIS — Z803 Family history of malignant neoplasm of breast: Secondary | ICD-10-CM

## 2022-07-06 DIAGNOSIS — Z1379 Encounter for other screening for genetic and chromosomal anomalies: Secondary | ICD-10-CM

## 2022-07-06 DIAGNOSIS — Z8041 Family history of malignant neoplasm of ovary: Secondary | ICD-10-CM

## 2022-07-06 DIAGNOSIS — Z800 Family history of malignant neoplasm of digestive organs: Secondary | ICD-10-CM

## 2022-07-06 NOTE — Telephone Encounter (Signed)
Revealed negative genetic testing.    

## 2022-07-07 DIAGNOSIS — F411 Generalized anxiety disorder: Secondary | ICD-10-CM | POA: Diagnosis not present

## 2022-07-15 DIAGNOSIS — F411 Generalized anxiety disorder: Secondary | ICD-10-CM | POA: Diagnosis not present

## 2022-07-31 ENCOUNTER — Other Ambulatory Visit: Payer: Self-pay

## 2022-07-31 DIAGNOSIS — Z0000 Encounter for general adult medical examination without abnormal findings: Secondary | ICD-10-CM

## 2022-07-31 DIAGNOSIS — Z130 Encounter for screening for diseases of the blood and blood-forming organs and certain disorders involving the immune mechanism: Secondary | ICD-10-CM

## 2022-08-09 NOTE — Progress Notes (Signed)
HPI:   Erin Montgomery was previously seen in the  Cancer Genetics clinic due to a family history of breast, ovarian, and other cancers and concerns regarding a hereditary predisposition to cancer.   Erin Montgomery's recent genetic test results were disclosed to them by telephone. These results and recommendations are discussed in more detail below.  CANCER HISTORY:  Oncology History   No history exists.    FAMILY HISTORY:  We obtained a detailed, 4-generation family history.  Significant diagnoses are listed below:      Family History  Problem Relation Age of Onset   Colon cancer Maternal Grandmother          dx 103s   Ovarian cancer Paternal Grandmother          dx 12s   Breast cancer Paternal Grandmother          dx 103s         Erin Montgomery is unaware of previous family history of genetic testing for hereditary cancer risks. They have limited information with several relatives. Paternal relatives are of Ashkenazi Jewish descent.  They report ~2% Ashkenazi Jewish ancestry in their mother. There is no known consanguinity.    Other relatives are unavailable for genetic testing at this time.   GENETIC TEST RESULTS:  The [facility] CancerNext-Expanded +RNAinsight Panel found no pathogenic mutations.   The CancerNext-Expanded gene panel offered by Erin Montgomery and includes sequencing, rearrangement, and RNA analysis for the following 71 genes:  AIP, ALK, APC, ATM, BAP1, BARD1, BMPR1A, BRCA1, BRCA2, BRIP1, CDC73, CDH1, CDK4, CDKN1B, CDKN2A, CHEK2, DICER1, FH, FLCN, KIF1B, LZTR1,MAX, MEN1, MET, MLH1, MSH2, MSH6, MUTYH, NF1, NF2, NTHL1, PALB2, PHOX2B, PMS2, POT1, PRKAR1A, PTCH1, PTEN, RAD51C,RAD51D, RB1, RET, SDHA, SDHAF2, SDHB, SDHC, SDHD, SMAD4, SMARCA4, SMARCB1, SMARCE1, STK11, SUFU, TMEM127, TP53,TSC1, TSC2 and VHL (sequencing and deletion/duplication); AXIN2, CTNNA1, EGFR, EGLN1, HOXB13, KIT, MITF, MSH3, PDGFRA, POLD1 and POLE (sequencing only); EPCAM and GREM1 (deletion/duplication only).  .   The test  report has been scanned into EPIC and is located under the Molecular Pathology section of the Results Review tab.  A portion of the result report is included below for reference. Genetic testing reported out on July 02, 2022.     Even though a pathogenic variant was not identified, possible explanations for the cancer in the family may include: There may be no hereditary risk for cancer in the family. The cancers in Erin Montgomery's family may be sporadic/familial or due to other genetic and environmental factors. There may be a gene mutation in one of these genes that current testing methods cannot detect but that chance is small. There could be another gene that has not yet been discovered, or that we have not yet tested, that is responsible for the cancer diagnoses in the family.  It is also possible there is a hereditary cause for the cancer in the family that Erin Montgomery did not inherit.  Therefore, it is important to remain in touch with cancer genetics in the future so that we can continue to offer them the most up to date genetic testing.     ADDITIONAL GENETIC TESTING:  Erin Montgomery's genetic testing was fairly extensive.  If there are additional relevant genes identified to increase cancer risk that can be analyzed in the future, we would be happy to discuss and coordinate this testing at that time.     CANCER SCREENING RECOMMENDATIONS:  Erin Montgomery's test result is considered negative (normal).  This means that we have not identified a hereditary cause for  Erin Montgomery's family history of cancer at this time.   An individual's cancer risk and medical management are not determined by genetic test results alone. Overall cancer risk assessment incorporates additional factors, including personal medical history, family history, and any available genetic information that may result in a personalized plan for cancer prevention and surveillance. Therefore, it is recommended Erin Montgomery continue to follow the cancer management and screening guidelines  provided by their primary healthcare provider.  RECOMMENDATIONS FOR FAMILY MEMBERS:   Since Erin Montgomery did not inherit a identifiable mutation in a cancer predisposition gene included on this panel, any future biological children of Erin Montgomery cannot have inherited a known mutation from Erin Montgomery in one of these genes. Individuals in this family might be at some increased risk of developing cancer, over the general population risk, due to the family history of cancer.  Individuals in the family should notify their providers of the family history of cancer. We recommend women in this family have a yearly mammogram beginning at age 44, or 27 years younger than the earliest onset of cancer, an annual clinical breast exam, and perform monthly breast self-exams.  Risk models that take into account family history and hormonal history may be helpful in determining appropriate breast cancer screening options for family members.  First degree relatives of those with colon cancer should receive colonoscopies beginning at age 64, or 10 years prior to the earliest diagnosis of colon cancer in the family, and receive colonoscopies at least every 5 years, or as recommended by their gastroenterologist.   Other members of the family may still carry a pathogenic variant in one of these genes that Erin Montgomery did not inherit. Based on the family history of their deceased paternal grandmother with breast and ovarian cancer, we recommend Erin Montgomery's father have genetic counseling and testing.    FOLLOW-UP:  Cancer genetics is a rapidly advancing field and it is possible that new genetic tests will be appropriate for Erin Montgomery and/or Erin Montgomery's family members in the future. We encourage them to remain in contact with cancer genetics, so we can update personal and family histories and let them know of advances in cancer genetics that may benefit this family.   Our contact number was provided. They know they are welcome to call us at anytime with additional questions or  concerns.   Aryka Coonradt M. Rennie Plowman, MS, [facility] Genetic Counselor [email or web address] (P) [phone]

## 2022-08-12 DIAGNOSIS — F411 Generalized anxiety disorder: Secondary | ICD-10-CM | POA: Diagnosis not present

## 2022-08-13 ENCOUNTER — Encounter (INDEPENDENT_AMBULATORY_CARE_PROVIDER_SITE_OTHER): Payer: 59 | Admitting: Family Medicine

## 2022-08-13 DIAGNOSIS — F411 Generalized anxiety disorder: Secondary | ICD-10-CM

## 2022-08-13 DIAGNOSIS — F331 Major depressive disorder, recurrent, moderate: Secondary | ICD-10-CM

## 2022-08-14 ENCOUNTER — Other Ambulatory Visit: Payer: Self-pay

## 2022-08-14 ENCOUNTER — Other Ambulatory Visit (HOSPITAL_COMMUNITY): Payer: Self-pay

## 2022-08-14 ENCOUNTER — Other Ambulatory Visit: Payer: 59

## 2022-08-14 ENCOUNTER — Other Ambulatory Visit: Payer: Self-pay | Admitting: Nurse Practitioner

## 2022-08-14 DIAGNOSIS — Z13228 Encounter for screening for other metabolic disorders: Secondary | ICD-10-CM | POA: Diagnosis not present

## 2022-08-14 DIAGNOSIS — Z130 Encounter for screening for diseases of the blood and blood-forming organs and certain disorders involving the immune mechanism: Secondary | ICD-10-CM | POA: Diagnosis not present

## 2022-08-14 DIAGNOSIS — E559 Vitamin D deficiency, unspecified: Secondary | ICD-10-CM

## 2022-08-14 DIAGNOSIS — Z0000 Encounter for general adult medical examination without abnormal findings: Secondary | ICD-10-CM | POA: Diagnosis not present

## 2022-08-14 DIAGNOSIS — Z1329 Encounter for screening for other suspected endocrine disorder: Secondary | ICD-10-CM | POA: Diagnosis not present

## 2022-08-14 DIAGNOSIS — Z1321 Encounter for screening for nutritional disorder: Secondary | ICD-10-CM | POA: Diagnosis not present

## 2022-08-14 MED ORDER — SERTRALINE HCL 50 MG PO TABS
50.0000 mg | ORAL_TABLET | Freq: Every day | ORAL | 3 refills | Status: AC
Start: 2022-08-14 — End: ?
  Filled 2022-08-14: qty 30, 30d supply, fill #0
  Filled 2022-10-02: qty 30, 30d supply, fill #1

## 2022-08-14 MED ORDER — VITAMIN D (ERGOCALCIFEROL) 1.25 MG (50000 UNIT) PO CAPS
50000.0000 [IU] | ORAL_CAPSULE | ORAL | 1 refills | Status: DC
  Filled 2022-08-14: qty 12, 84d supply, fill #0
  Filled 2022-11-01 – 2022-11-18 (×2): qty 12, 84d supply, fill #1

## 2022-08-14 NOTE — Telephone Encounter (Signed)
Please see the MyChart message reply(ies) for my assessment and plan.    This patient gave consent for this Medical Advice Message and is aware that it may result in a bill to [employer], as well as the possibility of receiving a bill for a co-payment or deductible. They are an established patient, but are not seeking medical advice exclusively about a problem treated during an in person or video visit in the last seven days. I did not recommend an in person or video visit within seven days of my reply.    I spent a total of 7 minutes cumulative time within 7 days through [employer].  Melida Quitter, PA

## 2022-08-14 NOTE — Addendum Note (Signed)
Addended by: Saralyn Pilar on: 08/14/2022 12:28 PM   Modules accepted: Orders

## 2022-08-15 LAB — LIPID PANEL
Chol/HDL Ratio: 4.1 ratio (ref 0.0–4.4)
Cholesterol, Total: 142 mg/dL (ref 100–199)
HDL: 35 mg/dL — ABNORMAL LOW (ref >39)
LDL Chol Calc (NIH): 88 mg/dL (ref 0–99)
Triglycerides: 102 mg/dL (ref 0–149)
VLDL Cholesterol Cal: 19 mg/dL (ref 5–40)

## 2022-08-15 LAB — CBC WITH DIFFERENTIAL/PLATELET
Basophils Absolute: 0 10*3/uL (ref 0.0–0.2)
Basos: 1 %
EOS (ABSOLUTE): 0.1 10*3/uL (ref 0.0–0.4)
Eos: 2 %
Hematocrit: 40.9 % (ref 34.0–46.6)
Hemoglobin: 13.5 g/dL (ref 11.1–15.9)
Immature Grans (Abs): 0 10*3/uL (ref 0.0–0.1)
Immature Granulocytes: 0 %
Lymphocytes Absolute: 2 10*3/uL (ref 0.7–3.1)
Lymphs: 34 %
MCH: 29.2 pg (ref 26.6–33.0)
MCHC: 33 g/dL (ref 31.5–35.7)
MCV: 88 fL (ref 79–97)
Monocytes Absolute: 0.3 10*3/uL (ref 0.1–0.9)
Monocytes: 6 %
Neutrophils Absolute: 3.3 10*3/uL (ref 1.4–7.0)
Neutrophils: 57 %
Platelets: 274 10*3/uL (ref 150–450)
RBC: 4.63 x10E6/uL (ref 3.77–5.28)
RDW: 13.2 % (ref 11.7–15.4)
WBC: 5.8 10*3/uL (ref 3.4–10.8)

## 2022-08-15 LAB — HEMOGLOBIN A1C
Est. average glucose Bld gHb Est-mCnc: 91 mg/dL
Hgb A1c MFr Bld: 4.8 % (ref 4.8–5.6)

## 2022-08-15 LAB — COMPREHENSIVE METABOLIC PANEL
ALT: 32 IU/L (ref 0–32)
AST: 27 IU/L (ref 0–40)
Albumin: 4.4 g/dL (ref 4.0–5.0)
Alkaline Phosphatase: 89 IU/L (ref 44–121)
BUN/Creatinine Ratio: 18 (ref 9–23)
BUN: 13 mg/dL (ref 6–20)
Bilirubin Total: 0.6 mg/dL (ref 0.0–1.2)
CO2: 22 mmol/L (ref 20–29)
Calcium: 9.3 mg/dL (ref 8.7–10.2)
Chloride: 106 mmol/L (ref 96–106)
Creatinine, Ser: 0.74 mg/dL (ref 0.57–1.00)
Globulin, Total: 2.6 g/dL (ref 1.5–4.5)
Glucose: 87 mg/dL (ref 70–99)
Potassium: 4.7 mmol/L (ref 3.5–5.2)
Sodium: 142 mmol/L (ref 134–144)
Total Protein: 7 g/dL (ref 6.0–8.5)
eGFR: 117 mL/min/{1.73_m2} (ref >59)

## 2022-08-15 LAB — TSH: TSH: 1.93 u[IU]/mL (ref 0.450–4.500)

## 2022-08-17 DIAGNOSIS — F411 Generalized anxiety disorder: Secondary | ICD-10-CM | POA: Diagnosis not present

## 2022-08-19 ENCOUNTER — Ambulatory Visit (INDEPENDENT_AMBULATORY_CARE_PROVIDER_SITE_OTHER): Payer: 59 | Admitting: Family Medicine

## 2022-08-19 ENCOUNTER — Encounter: Payer: Self-pay | Admitting: Family Medicine

## 2022-08-19 VITALS — BP 122/73 | HR 93 | Temp 97.8°F | Ht 63.75 in | Wt 245.8 lb

## 2022-08-19 DIAGNOSIS — F331 Major depressive disorder, recurrent, moderate: Secondary | ICD-10-CM | POA: Diagnosis not present

## 2022-08-19 DIAGNOSIS — Z0000 Encounter for general adult medical examination without abnormal findings: Secondary | ICD-10-CM | POA: Diagnosis not present

## 2022-08-19 NOTE — Assessment & Plan Note (Signed)
Continue sertraline 50 mg daily, bupropion 150 mg daily.  In the future, if not helpful can go back to sertraline 25 mg daily and increase bupropion to 300 mg daily.  Will continue to monitor.  Continue working with therapist.

## 2022-08-19 NOTE — Progress Notes (Signed)
Complete physical exam  Patient: Erin Montgomery   DOB: 19-Jun-2000   22 y.o. Adult  MRN: [id number]  Subjective:    Chief Complaint  Patient presents with   Annual Exam    Erin Montgomery is a 22 y.o. adult who presents today for a complete physical exam. Erin Montgomery reports consuming a general diet.  They are very active doing CrossFit and playing rugby.  Erin Montgomery generally feels well.  They are a bit concerned about their weight and have felt pressure from their mother and sister to start an injectable weight management medication like Wegovy.  They have a history of anorexia that they have been working through in therapy as well as a complicated relationship with body image.  They have had significant medical trauma in the past with healthcare providers blaming problems on a larger body.  Erin Montgomery reports sleeping poorly. Erin Montgomery does not have additional problems to discuss today.    Most recent fall risk assessment:    08/19/2022    1:29 PM  Fall Risk   Falls in the past year? 1  Number falls in past yr: 1  Injury with Fall? 0  Risk for fall due to : History of fall(s)  Follow up Falls evaluation completed     Most recent depression and anxiety screenings:    08/19/2022    1:31 PM 03/16/2022    2:54 PM  PHQ 2/9 Scores  PHQ - 2 Score 2 1  PHQ- 9 Score 10 7      08/19/2022    1:31 PM 03/16/2022    2:54 PM 02/12/2022    4:08 PM 02/05/2022    8:39 AM  GAD 7 : Generalized Anxiety Score  Nervous, Anxious, on Edge 1 1  1   Control/stop worrying 1 1  1   Worry too much - different things 1 1  1   Trouble relaxing 1 1  1   Restless 1 1  1   Easily annoyed or irritable 2 1  1   Afraid - awful might happen 2 1  1   Total GAD 7 Score 9 7  7   Anxiety Difficulty Very difficult        Information is confidential and restricted. Go to Review Flowsheets to unlock data.    Patient Active Problem List   Diagnosis Date Noted   Food allergy 02/05/2022   Palpitation 11/10/2021   Postural orthostatic tachycardia  syndrome 11/10/2021   Other fatigue 08/05/2021   Vitamin D deficiency 08/05/2021   Body mass index (BMI) of 39.0-39.9 in adult 02/02/2021   Elevated prolactin level 07/07/2020   History of DVT in adulthood 06/07/2020   Attention deficit hyperactivity disorder (ADHD) 06/07/2020   History of suicide attempt 06/07/2020   Panic disorder 01/26/2019   Moderate episode of recurrent major depressive disorder (HCC) 08/02/2018   Factor V Leiden mutation (HCC) 07/23/2018   Amblyopia, left eye 07/23/2018    Past Surgical History:  Procedure Laterality Date   broken ankle Right    EYE SURGERY     x5   wisdom teeth removal  10/08/2021   Social History   Tobacco Use   Smoking status: Never   Smokeless tobacco: Never  Vaping Use   Vaping status: Never Used  Substance Use Topics   Alcohol use: Yes    Comment: 2 drinks/month   Drug use: Never   Family History  Problem Relation Age of Onset   Hypertension Mother    Alcoholism Mother    Heart attack Mother  Depression Mother    High blood pressure Mother    High Cholesterol Mother    High blood pressure Father    High Cholesterol Father    Diabetes Maternal Aunt    Colon cancer Maternal Grandmother        dx 9s   Heart attack Maternal Grandmother    Heart attack Maternal Grandfather    Depression Maternal Grandfather    Ovarian cancer Paternal Grandmother        dx 26s   Breast cancer Paternal Grandmother        dx 44s   Alcoholism Paternal Grandmother    Alcoholism Paternal Grandfather    No Known Allergies   Patient Care Team: Melida Quitter, PA as PCP - General (Family Medicine) Patton Salles, MD as Consulting Physician (Obstetrics and Gynecology)   Outpatient Medications Prior to Visit  Medication Sig   buPROPion (WELLBUTRIN XL) 150 MG 24 hr tablet Take 1 tablet (150 mg total) by mouth daily.   nystatin (MYCOSTATIN/NYSTOP) powder Apply to affected area 3 times a day for up to 7 days   sertraline  (ZOLOFT) 50 MG tablet Take 1 tablet (50 mg total) by mouth daily.   Vitamin D, Ergocalciferol, (DRISDOL) 1.25 MG (50000 UNIT) CAPS capsule Take 1 capsule by mouth every 7 days.   No facility-administered medications prior to visit.    Review of Systems  Constitutional:  Negative for chills, fever and malaise/fatigue.  HENT:  Negative for congestion and hearing loss.   Eyes:  Negative for blurred vision and double vision.  Respiratory:  Negative for cough and shortness of breath.   Cardiovascular:  Negative for chest pain, palpitations and leg swelling.  Gastrointestinal:  Negative for abdominal pain, constipation, diarrhea and heartburn.  Genitourinary:  Negative for frequency and urgency.  Musculoskeletal:  Negative for myalgias and neck pain.  Neurological:  Negative for headaches.  Endo/Heme/Allergies:  Negative for polydipsia.  Psychiatric/Behavioral:  Negative for depression. The patient has insomnia. The patient is not nervous/anxious.      Objective:    BP 122/73   Pulse 93   Temp 97.8 F (36.6 C) (Oral)   Ht 5' 3.75" (1.619 m)   Wt 245 lb 12 oz (111.5 kg)   LMP 07/27/2022 (Approximate)   SpO2 99%   BMI 42.51 kg/m    Physical Exam Constitutional:      General: Erin Montgomery is not in acute distress.    Appearance: Normal appearance.  HENT:     Head: Normocephalic and atraumatic.     Right Ear: Tympanic membrane, ear canal and external ear normal. There is no impacted cerumen.     Left Ear: Tympanic membrane, ear canal and external ear normal. There is no impacted cerumen.     Nose: Nose normal.     Mouth/Throat:     Mouth: Mucous membranes are moist.     Pharynx: No oropharyngeal exudate or posterior oropharyngeal erythema.  Eyes:     Extraocular Movements: Extraocular movements intact.     Conjunctiva/sclera: Conjunctivae normal.     Pupils: Pupils are equal, round, and reactive to light.     Comments: Wears glasses, up-to-date  Neck:     Thyroid: No thyroid mass,  thyromegaly or thyroid tenderness.  Cardiovascular:     Rate and Rhythm: Normal rate and regular rhythm.     Heart sounds: Normal heart sounds. No murmur heard.    No friction rub. No gallop.  Pulmonary:  Effort: Pulmonary effort is normal. No respiratory distress.     Breath sounds: Normal breath sounds. No wheezing, rhonchi or rales.  Abdominal:     General: Abdomen is flat. Bowel sounds are normal. There is no distension.     Palpations: There is no mass.     Tenderness: There is no abdominal tenderness. There is no guarding.  Musculoskeletal:        General: Normal range of motion.     Cervical back: Normal range of motion and neck supple.  Lymphadenopathy:     Cervical: No cervical adenopathy.  Skin:    General: Skin is warm and dry.  Neurological:     Mental Status: [name] is alert and oriented to person, place, and time.     Cranial Nerves: No cranial nerve deficit.     Motor: No weakness.     Deep Tendon Reflexes: Reflexes normal.  Psychiatric:        Mood and Affect: Mood normal.        Assessment & Plan:    Routine Health Maintenance and Physical Exam  Immunization History  Administered Date(s) Administered   Influenza,inj,Quad PF,6+ Mos 10/14/2020   Influenza-Unspecified 11/30/2021   Moderna SARS-COV2 Booster Vaccination 05/30/2020, 11/30/2021   Pfizer Covid-19 Vaccine Bivalent Booster 57yrs & up 10/14/2020   Tdap 04/07/2021    Health Maintenance  Topic Date Due   HPV VACCINES (1 - Risk 3-dose series) Never done   COVID-19 Vaccine (2 - Pfizer risk series) 12/21/2021   INFLUENZA VACCINE  08/27/2022   PAP-Cervical Cytology Screening  05/04/2025   PAP SMEAR-Modifier  05/04/2025   DTaP/Tdap/Td (2 - Td or Tdap) 04/08/2031   Hepatitis C Screening  Completed   HIV Screening  Completed   Requesting copies of HPV vaccines from previous provider in [place].  Discussed health benefits of physical activity, and encouraged Erin Montgomery to engage in regular exercise  appropriate for Erin Montgomery's age and condition.  Wellness examination  Moderate episode of recurrent major depressive disorder (HCC) Assessment & Plan: Continue sertraline 50 mg daily, bupropion 150 mg daily.  In the future, if not helpful can go back to sertraline 25 mg daily and increase bupropion to 300 mg daily.  Will continue to monitor.  Continue working with therapist.   We discussed that BMI alone is not an accurate indicator of health.  Blood pressure, lab results were fantastic.  They are very active and fuel their body with a balanced diet.  They will let me know in the future if they are interested in discussing weight again, but for now continue with all of their healthy habits.  Return in about 2 months (around 10/20/2022) for follow-up for Zoloft and Wellbutrin, in person or video visit.     Melida Quitter, PA

## 2022-08-19 NOTE — Patient Instructions (Signed)
Keep up the amazing work! I'll see you in a couple of months, but if you feel that we need to adjust Zoloft or Wellbutrin before then just send me a message :)

## 2022-08-21 ENCOUNTER — Other Ambulatory Visit: Payer: Self-pay

## 2022-09-10 DIAGNOSIS — F411 Generalized anxiety disorder: Secondary | ICD-10-CM | POA: Diagnosis not present

## 2022-09-13 ENCOUNTER — Encounter: Payer: Self-pay | Admitting: Family Medicine

## 2022-09-16 DIAGNOSIS — D2262 Melanocytic nevi of left upper limb, including shoulder: Secondary | ICD-10-CM | POA: Diagnosis not present

## 2022-09-16 DIAGNOSIS — L304 Erythema intertrigo: Secondary | ICD-10-CM | POA: Diagnosis not present

## 2022-09-16 DIAGNOSIS — Z7189 Other specified counseling: Secondary | ICD-10-CM | POA: Diagnosis not present

## 2022-09-16 DIAGNOSIS — D225 Melanocytic nevi of trunk: Secondary | ICD-10-CM | POA: Diagnosis not present

## 2022-09-16 DIAGNOSIS — L814 Other melanin hyperpigmentation: Secondary | ICD-10-CM | POA: Diagnosis not present

## 2022-09-16 DIAGNOSIS — D224 Melanocytic nevi of scalp and neck: Secondary | ICD-10-CM | POA: Diagnosis not present

## 2022-09-17 ENCOUNTER — Other Ambulatory Visit (HOSPITAL_COMMUNITY): Payer: Self-pay

## 2022-09-17 ENCOUNTER — Other Ambulatory Visit: Payer: Self-pay

## 2022-09-17 MED ORDER — KETOCONAZOLE 2 % EX CREA
1.0000 | TOPICAL_CREAM | Freq: Two times a day (BID) | CUTANEOUS | 3 refills | Status: AC
  Filled 2022-09-17: qty 60, 30d supply, fill #0
  Filled 2022-12-26: qty 60, 30d supply, fill #1

## 2022-09-29 DIAGNOSIS — F411 Generalized anxiety disorder: Secondary | ICD-10-CM | POA: Diagnosis not present

## 2022-10-02 ENCOUNTER — Other Ambulatory Visit: Payer: Self-pay

## 2022-10-20 ENCOUNTER — Ambulatory Visit: Payer: 59 | Admitting: Family Medicine

## 2022-10-22 ENCOUNTER — Encounter: Payer: Self-pay | Admitting: Family Medicine

## 2022-10-27 ENCOUNTER — Other Ambulatory Visit: Payer: Self-pay

## 2022-10-27 ENCOUNTER — Encounter: Payer: Self-pay | Admitting: Family Medicine

## 2022-10-27 ENCOUNTER — Other Ambulatory Visit (HOSPITAL_COMMUNITY): Payer: Self-pay

## 2022-10-27 ENCOUNTER — Telehealth (INDEPENDENT_AMBULATORY_CARE_PROVIDER_SITE_OTHER): Payer: 59 | Admitting: Family Medicine

## 2022-10-27 DIAGNOSIS — F5104 Psychophysiologic insomnia: Secondary | ICD-10-CM

## 2022-10-27 DIAGNOSIS — F411 Generalized anxiety disorder: Secondary | ICD-10-CM | POA: Diagnosis not present

## 2022-10-27 DIAGNOSIS — F331 Major depressive disorder, recurrent, moderate: Secondary | ICD-10-CM

## 2022-10-27 DIAGNOSIS — G4700 Insomnia, unspecified: Secondary | ICD-10-CM | POA: Insufficient documentation

## 2022-10-27 MED ORDER — SERTRALINE HCL 50 MG PO TABS
50.0000 mg | ORAL_TABLET | Freq: Every day | ORAL | 3 refills | Status: DC
Start: 2022-10-27 — End: 2023-01-29
  Filled 2022-10-27: qty 90, 90d supply, fill #0

## 2022-10-27 MED ORDER — QUETIAPINE FUMARATE 50 MG PO TABS
50.0000 mg | ORAL_TABLET | Freq: Every day | ORAL | 1 refills | Status: DC
Start: 2022-10-27 — End: 2023-01-21
  Filled 2022-10-27: qty 90, 30d supply, fill #0
  Filled 2022-12-26: qty 90, 30d supply, fill #1

## 2022-10-27 MED ORDER — BUPROPION HCL ER (XL) 150 MG PO TB24
150.0000 mg | ORAL_TABLET | Freq: Every day | ORAL | 3 refills | Status: DC
Start: 2022-10-27 — End: 2023-01-29
  Filled 2022-10-27 – 2022-12-26 (×2): qty 90, 90d supply, fill #0

## 2022-10-27 NOTE — Progress Notes (Signed)
Virtual Visit via Video Note  I connected with Erin Montgomery on 10/27/22 at  9:30 AM EDT by a video enabled telemedicine application and verified that I am speaking with the correct person using two identifiers.  Patient Location: Home Provider Location: Office/clinic  I discussed the limitations, risks, security, and privacy concerns of performing an evaluation and management service by video and the availability of in person appointments. I also discussed with the patient that there may be a patient responsible charge related to this service. The patient expressed understanding and agreed to proceed.    Subjective   Patient ID: Erin Montgomery, adult    DOB: 02-Apr-2000  Age: 22 y.o. MRN: [id number]  Chief Complaint  Patient presents with   Anxiety   Depression    HPI Erin Montgomery is a 22 y.o. adult presenting today for follow up of mood. Mood: Patient is here to follow up for depression and anxiety, currently managing with sertraline 50 mg daily, Wellbutrin 150 mg daily. Taking medication without side effects, reports excellent compliance with treatment. Denies mood changes or SI/HI. KJ states that during the day, anxiety is very well-controlled.  At night, it is preventing them from falling asleep and staying asleep because of the racing thoughts that persist.  They are open to starting a medication to help with sleep.  Currently, they take sertraline at night and Wellbutrin in the morning.  They are also very excited because they got engaged to their partner since her last appointment and will be moving to [place] in January.     10/27/2022    9:23 AM 08/19/2022    1:31 PM 03/16/2022    2:54 PM  Depression screen PHQ 2/9  Decreased Interest 1 1 1   Down, Depressed, Hopeless 1 1 0  PHQ - 2 Score 2 2 1   Altered sleeping 3 1 1   Tired, decreased energy 2 2 1   Change in appetite 1 1 1   Feeling bad or failure about yourself  1 0 1  Trouble concentrating 1 3 1   Moving slowly or  fidgety/restless 1 1 1   Suicidal thoughts 0 0 0  PHQ-9 Score 11 10 7   Difficult doing work/chores Not difficult at all Somewhat difficult        10/27/2022    9:25 AM 08/19/2022    1:31 PM 03/16/2022    2:54 PM 02/12/2022    4:08 PM  GAD 7 : Generalized Anxiety Score  Nervous, Anxious, on Edge 3 1 1    Control/stop worrying 2 1 1    Worry too much - different things 2 1 1    Trouble relaxing 2 1 1    Restless 2 1 1    Easily annoyed or irritable 1 2 1    Afraid - awful might happen 3 2 1    Total GAD 7 Score 15 9 7    Anxiety Difficulty Somewhat difficult Very difficult       Information is confidential and restricted. Go to Review Flowsheets to unlock data.    ROS Negative unless otherwise noted in HPI  Outpatient Medications Prior to Visit  Medication Sig   ketoconazole (NIZORAL) 2 % cream Apply to affected area twice daily for 2-3 weeks when flaring   nystatin (MYCOSTATIN/NYSTOP) powder Apply to affected area 3 times a day for up to 7 days   Vitamin D, Ergocalciferol, (DRISDOL) 1.25 MG (50000 UNIT) CAPS capsule Take 1 capsule by mouth every 7 days.   [DISCONTINUED] buPROPion (WELLBUTRIN XL) 150 MG 24  hr tablet Take 1 tablet (150 mg total) by mouth daily.   [DISCONTINUED] sertraline (ZOLOFT) 50 MG tablet Take 1 tablet (50 mg total) by mouth daily.   No facility-administered medications prior to visit.     Objective:     Physical Exam General: Speaking clearly in complete sentences without any shortness of breath.  Alert and oriented x3.  Normal judgment. No apparent acute distress.    Assessment & Plan:  Moderate episode of recurrent major depressive disorder (HCC) Assessment & Plan: PHQ-9 score fairly stable at 11. Continue Zoloft 50 mg daily, Wellbutrin 150 mg daily.  Augmenting with quetiapine 50 mg daily at bedtime for anxiety, depression, and insomnia.  May increase dose after 3 days to 150 mg at bedtime if starting dose is ineffective.  Follow-up in about 6 weeks to  assess efficacy.  Orders: -     Sertraline HCl; Take 1 tablet (50 mg total) by mouth daily.  Dispense: 90 tablet; Refill: 3 -     buPROPion HCl ER (XL); Take 1 tablet (150 mg total) by mouth daily.  Dispense: 90 tablet; Refill: 3  Generalized anxiety disorder Assessment & Plan: GAD-7 score increased to 15.  Continue Zoloft 50 mg daily, Wellbutrin 150 mg daily.  Augmenting with quetiapine 50 mg daily at bedtime for anxiety, depression, and insomnia.  We discussed the dose may be increased after 3 days to 150 mg daily at bedtime if needed.  Patient verbalized understanding and is agreeable to this plan.  Orders: -     Sertraline HCl; Take 1 tablet (50 mg total) by mouth daily.  Dispense: 90 tablet; Refill: 3  Psychophysiological insomnia -     QUEtiapine Fumarate; Take 1 tablet (50 mg total) by mouth at bedtime. May increase to 150 mg (3 tablets) if needed after 3 days.  Dispense: 90 tablet; Refill: 1    Return in about 6 weeks (around 12/08/2022) for follow-up for mood and sleep, in person or video.   I discussed the assessment and treatment plan with the patient. The patient was provided an opportunity to ask questions, and all were answered. The patient agreed with the plan and demonstrated an understanding of the instructions.   The patient was advised to call back or seek an in-person evaluation if the symptoms worsen or if the condition fails to improve as anticipated.  The above assessment and management plan was discussed with the patient. The patient verbalized understanding of and has agreed to the management plan.   Melida Quitter, PA

## 2022-10-27 NOTE — Assessment & Plan Note (Signed)
PHQ-9 score fairly stable at 11. Continue Zoloft 50 mg daily, Wellbutrin 150 mg daily.  Augmenting with quetiapine 50 mg daily at bedtime for anxiety, depression, and insomnia.  May increase dose after 3 days to 150 mg at bedtime if starting dose is ineffective.  Follow-up in about 6 weeks to assess efficacy.

## 2022-10-27 NOTE — Assessment & Plan Note (Signed)
GAD-7 score increased to 15.  Continue Zoloft 50 mg daily, Wellbutrin 150 mg daily.  Augmenting with quetiapine 50 mg daily at bedtime for anxiety, depression, and insomnia.  We discussed the dose may be increased after 3 days to 150 mg daily at bedtime if needed.  Patient verbalized understanding and is agreeable to this plan.

## 2022-11-02 ENCOUNTER — Other Ambulatory Visit: Payer: Self-pay

## 2022-11-11 DIAGNOSIS — F411 Generalized anxiety disorder: Secondary | ICD-10-CM | POA: Diagnosis not present

## 2022-11-18 ENCOUNTER — Other Ambulatory Visit (HOSPITAL_COMMUNITY): Payer: Self-pay

## 2022-11-19 NOTE — Progress Notes (Signed)
Office Visit Note  Patient: Erin Montgomery             Date of Birth: 08-Mar-2000           MRN: [id number]             PCP: Melida Quitter, PA Referring: Kathryne Hitch* Visit Date: 12/03/2022 Occupation: @GUAROCC @  Subjective:  Pain in multiple joints  History of Present Illness: Erin Montgomery is a 22 y.o. adult seen in consultation per request of Dr. Magnus Ivan.  According to the patient the symptoms are started when Erin Montgomery was a child and was diagnosed with growing pains by the pediatrician.  Erin Montgomery played sports in high school and college.  At age 84, Erin Montgomery recalls developing ankle joint swelling to the point that they had difficulty walking.  Erin Montgomery was seen by an orthopedic surgeon who could not find anything on the examination and advised that it was a sprain.  The symptoms recurred soon and lasted for about 2 months and then resolved.  Patient states that they developed a right fibular fracture while hiking.  It required surgery.  They have had right ankle joint pain since then.  Erin Montgomery complains of pain and discomfort in both knees and both ankles.  They were evaluated by Dr. Magnus Ivan on June 29, 2022 at the time x-rays of the bilateral knee joints were unremarkable.  Erin Montgomery was referred here for further evaluation.  Erin Montgomery complains of ongoing pain and discomfort in multiple joints.  The pain is mostly limited to shoulders, elbows, hips, knees and ankles.  There is no history of pain and discomfort in her hands and feet.  Patient states that they used to play rugby and participate in [employer] but due to arthralgias had to stop doing it.  There is no history of oral ulcers, nasal ulcers, malar rash, photosensitivity, lymphadenopathy.  According to the patient  they notice intermittent redness in the hands when the weather is cold outside.  There is no family history of autoimmune disease. There is history of gout in their father.  Gravida 0.  Patient recalls taking oral contraceptive pills in high school for  acne.  At the time there was screened for factor V Leiden which was positive.  Erin Montgomery denies any history of DVT.    Activities of Daily Living:  Patient reports morning stiffness for 10-15 minutes.   Patient Reports nocturnal pain.  Difficulty dressing/grooming: Reports Difficulty climbing stairs: Reports Difficulty getting out of chair: Reports Difficulty using hands for taps, buttons, cutlery, and/or writing: Reports  Review of Systems  Constitutional:  Positive for fatigue.  HENT:  Negative for mouth sores and mouth dryness.   Eyes:  Negative for dryness.  Respiratory:  Positive for shortness of breath.   Cardiovascular:  Positive for chest pain and palpitations.  Gastrointestinal:  Positive for constipation and diarrhea. Negative for blood in stool.  Endocrine: Negative for increased urination.  Genitourinary:  Negative for involuntary urination.  Musculoskeletal:  Positive for joint pain, gait problem, joint pain, joint swelling, myalgias, morning stiffness, muscle tenderness and myalgias. Negative for muscle weakness.  Skin:  Positive for color change. Negative for rash, hair loss and sensitivity to sunlight.  Allergic/Immunologic: Positive for susceptible to infections.  Neurological:  Positive for dizziness and headaches. Negative for numbness.  Hematological:  Negative for swollen glands.  Psychiatric/Behavioral:  Positive for depressed mood. Negative for sleep disturbance. The patient is nervous/anxious.     PMFS History:  Patient Active Problem  List   Diagnosis Date Noted   Concussion 11/24/2022   Generalized anxiety disorder 10/27/2022   Insomnia 10/27/2022   Food allergy 02/05/2022   Palpitation 11/10/2021   Postural orthostatic tachycardia syndrome 11/10/2021   Other fatigue 08/05/2021   Vitamin D deficiency 08/05/2021   Body mass index (BMI) of 39.0-39.9 in adult 02/02/2021   Elevated prolactin level 07/07/2020   History of DVT in adulthood 06/07/2020   Attention  deficit hyperactivity disorder (ADHD) 06/07/2020   History of suicide attempt 06/07/2020   Panic disorder 01/26/2019   Moderate episode of recurrent major depressive disorder (HCC) 08/02/2018   Factor V Leiden mutation (HCC) 07/23/2018   Amblyopia, left eye 07/23/2018    Past Medical History:  Diagnosis Date   Anxiety    Clotting disorder (HCC)    Factor V Leiden   Depression    Dysmenorrhea     Family History  Problem Relation Age of Onset   Hypertension Mother    Alcoholism Mother    Heart attack Mother    Depression Mother    High blood pressure Mother    High Cholesterol Mother    High blood pressure Father    High Cholesterol Father    Diabetes Maternal Aunt    Colon cancer Maternal Grandmother        dx 62s   Heart attack Maternal Grandmother    Heart attack Maternal Grandfather    Depression Maternal Grandfather    Ovarian cancer Paternal Grandmother        dx 45s   Breast cancer Paternal Grandmother        dx 21s   Alcoholism Paternal Grandmother    Alcoholism Paternal Grandfather    Past Surgical History:  Procedure Laterality Date   EYE SURGERY     x5   LEG SURGERY Right 2020   wisdom teeth removal  10/08/2021   Social History   Social History Narrative   Not on file   Immunization History  Administered Date(s) Administered   Influenza,inj,Quad PF,6+ Mos 10/14/2020   Influenza-Unspecified 11/30/2021   Moderna SARS-COV2 Booster Vaccination 05/30/2020, 11/30/2021   Pfizer Covid-19 Vaccine Bivalent Booster 79yrs & up 10/14/2020   Tdap 04/07/2021     Objective: Vital Signs: BP 130/82 (BP Location: Left Arm, Patient Position: Sitting, Cuff Size: Normal)   Pulse 85   Resp 16   Ht 5\' 3"  (1.6 m)   Wt 243 lb 3.2 oz (110.3 kg)   LMP 11/11/2022   BMI 43.08 kg/m    Physical Exam Vitals and nursing note reviewed.  Constitutional:      Appearance: Erin Montgomery is well-developed.  HENT:     Head: Normocephalic and atraumatic.  Eyes:     Conjunctiva/sclera:  Conjunctivae normal.     Pupils: Pupils are equal, round, and reactive to light.  Cardiovascular:     Rate and Rhythm: Normal rate and regular rhythm.     Heart sounds: Normal heart sounds.  Pulmonary:     Effort: Pulmonary effort is normal.     Breath sounds: Normal breath sounds.  Abdominal:     General: Bowel sounds are normal.     Palpations: Abdomen is soft.  Musculoskeletal:     Cervical back: Normal range of motion and neck supple.  Skin:    General: Skin is warm and dry.     Capillary Refill: Capillary refill takes less than 2 seconds.  Neurological:     Mental Status: Erin Montgomery is alert and oriented to person, place,  and time.  Psychiatric:        Behavior: Behavior normal.      Musculoskeletal Exam: Cervical, thoracic and lumbar spine 1 good range of motion.  Shoulder joints, elbow joints, wrist joints, MCPs PIPs and DIPs Juengel range of motion with no synovitis.  Hip joints and knee joints in good range of motion without any warmth swelling or effusion.  Thickening of the right ankle joint and surgical scar was noted.  Left ankle joint was in full range of motion.  There was no synovitis over MTP joints or PIP joints.  No dactylitis for plantar fasciitis was noted.  CDAI Exam: CDAI Score: -- Patient Global: --; Provider Global: -- Swollen: --; Tender: -- Joint Exam 12/03/2022   No joint exam has been documented for this visit   There is currently no information documented on the homunculus. Go to the Rheumatology activity and complete the homunculus joint exam.  Investigation: No additional findings.  Imaging: No results found.  Recent Labs: Lab Results  Component Value Date   WBC 5.8 08/14/2022   HGB 13.5 08/14/2022   PLT 274 08/14/2022   NA 142 08/14/2022   K 4.7 08/14/2022   CL 106 08/14/2022   CO2 22 08/14/2022   GLUCOSE 87 08/14/2022   BUN 13 08/14/2022   CREATININE 0.74 08/14/2022   BILITOT 0.6 08/14/2022   ALKPHOS 89 08/14/2022   AST 27 08/14/2022    ALT 32 08/14/2022   PROT 7.0 08/14/2022   ALBUMIN 4.4 08/14/2022   CALCIUM 9.3 08/14/2022   May 05, 2022 HIV nonreactive, hepatitis C nonreactive, RPR nonreactive August 14, 2022 LDL 88, TSH normal  June 29, 2022 x-rays of bilateral knee joints were unremarkable.  Speciality Comments: No specialty comments available.  Procedures:  No procedures performed Allergies: Patient has no known allergies.   Assessment / Plan:     Visit Diagnoses: Polyarthralgia -patient complains of pain and discomfort in multiple joints since childhood.  They also gives history of intermittent increased pain in the knee joints and ankle joints.  Patient was evaluated by Dr. Magnus Ivan in June and at the time x-rays of bilateral knee joints were unremarkable.  No synovitis was noted on the examination today.  Patient denies any history of myalgias.  I will obtain following labs today.  Plan: CK, Sedimentation rate, Rheumatoid factor, Cyclic citrul peptide antibody, IgG, ANA, Uric acid.  We will contact Erin Montgomery once the lab results are available.  Chronic pain of both knees-patient gives history of pain and discomfort of bilateral knee joints.  No warmth swelling or effusion was noted.  Patient recalls having intermittent swelling in the right knee joint.  X-rays were unremarkable on June 29, 2022 which were reviewed.  A handout on lower extremity muscle strengthening exercise was given.  Chronic pain of both ankles-patient had right fibular fracture at age 43.  It required surgery.  Some thickening of the right ankle joint and some warmth was noted.  Left ankle joint was in full range of motion without any warmth swelling or effusion.  There was no tenderness over MTPs or PIPs.  Patient has hardware in the right ankle.  I will obtain labs today.  Other fatigue-patient is history of chronic fatigue which they are related to vitamin D deficiency.  Patient also has chronic insomnia.  Psychophysiological insomnia-patient is on  Seroquel.  Vitamin D deficiency -patient has been taking vitamin D supplement.  Previous vitamin D was low.  Will repeat vitamin D level today.  Plan: VITAMIN D 25 Hydroxy (Vit-D Deficiency, Fractures)  Factor V Leiden mutation (HCC)-it was positive when tested as a teenager.  Patient denies any history of DVTs.  Other medical problems are listed as follows:  Moderate episode of recurrent major depressive disorder (HCC)  Generalized anxiety disorder  Panic disorder  History of suicide attempt - 2019,2021  Attention deficit hyperactivity disorder (ADHD), unspecified ADHD type  Palpitation  Postural orthostatic tachycardia syndrome  Amblyopia, left eye  Elevated prolactin level  Orders: Orders Placed This Encounter  Procedures   CK   VITAMIN D 25 Hydroxy (Vit-D Deficiency, Fractures)   Sedimentation rate   Rheumatoid factor   Cyclic citrul peptide antibody, IgG   ANA   Uric acid   No orders of the defined types were placed in this encounter.    Follow-Up Instructions: Return for polyarthralgia.   Pollyann Savoy, MD  Note - This record has been created using Animal nutritionist.  Chart creation errors have been sought, but may not always  have been located. Such creation errors do not reflect on  the standard of medical care.

## 2022-11-21 DIAGNOSIS — X58XXXA Exposure to other specified factors, initial encounter: Secondary | ICD-10-CM | POA: Diagnosis not present

## 2022-11-21 DIAGNOSIS — S0990XA Unspecified injury of head, initial encounter: Secondary | ICD-10-CM | POA: Diagnosis not present

## 2022-11-24 ENCOUNTER — Other Ambulatory Visit (HOSPITAL_COMMUNITY): Payer: Self-pay

## 2022-11-24 ENCOUNTER — Encounter: Payer: Self-pay | Admitting: Family Medicine

## 2022-11-24 ENCOUNTER — Other Ambulatory Visit: Payer: Self-pay

## 2022-11-24 ENCOUNTER — Ambulatory Visit (INDEPENDENT_AMBULATORY_CARE_PROVIDER_SITE_OTHER): Payer: 59 | Admitting: Family Medicine

## 2022-11-24 VITALS — BP 130/79 | HR 88 | Ht 63.75 in | Wt 244.8 lb

## 2022-11-24 DIAGNOSIS — S060X0D Concussion without loss of consciousness, subsequent encounter: Secondary | ICD-10-CM

## 2022-11-24 DIAGNOSIS — S060XAA Concussion with loss of consciousness status unknown, initial encounter: Secondary | ICD-10-CM | POA: Insufficient documentation

## 2022-11-24 MED ORDER — ONDANSETRON HCL 4 MG PO TABS
4.0000 mg | ORAL_TABLET | Freq: Three times a day (TID) | ORAL | 0 refills | Status: DC | PRN
  Filled 2022-11-24: qty 20, 7d supply, fill #0

## 2022-11-24 NOTE — Assessment & Plan Note (Addendum)
Symptoms improving but still present at rest.  These include headache, dizziness, photophobia and phonophobia.  Recommended patient wait until they are asymptomatic before returning to activity and then progressing from light to moderate to high intensity activity with finally full contact as long as they are asymptomatic.  Continue ibuprofen and Tylenol as needed - Zofran sent in for nausea.

## 2022-11-24 NOTE — Patient Instructions (Signed)
It was nice to see you today,  We addressed the following topics today: -For your concussions, you should return to activity in a stepwise fashion based on your symptoms. - Starting at rest, if you are not having any symptoms of concussion including headache, nausea, photophobia, dizziness, etc. you can begin light activity such as brisk walking.  When you become symptom-free with this you can then progress to jogging and moderate activity.  After that comes heavy noncontact activities such as sprinting, weight lifting.  If you are symptom-free with these activities you can progress to contact and practice again.  At that point I can give you a note that states you can return to practice.   Have a great day,  Frederic Jericho, MD

## 2022-11-24 NOTE — Progress Notes (Signed)
   Acute Office Visit  Subjective:     Patient ID: Erin Montgomery, adult    DOB: [birth year], 22 y.o.   MRN: [id number]  Chief Complaint  Patient presents with   Follow-up    HPI  The patient is here today for follow-up of a concussion sustained on Saturday during rugby practice.  They were experiencing dizziness, confusion, nausea, photophobia and phonophobia, headache.  The symptoms are still ongoing but improving.  Their symptoms worsen when they are using the computer for an extended period of time.  We discussed a stepwise approach to returning to practice based on symptoms.   ROS      Objective:    BP 130/79   Pulse 88   Ht 5' 3.75" (1.619 m)   Wt 244 lb 12.8 oz (111 kg)   LMP 11/11/2022   SpO2 99%   BMI 42.35 kg/m    Physical Exam General: Alert, oriented HEENT: PERRLA, EOMI.  Exotropia of the left eye Psych: Pleasant.  No results found for any visits on 11/24/22.      Assessment & Plan:   Concussion without loss of consciousness, subsequent encounter Assessment & Plan: Symptoms improving but still present at rest.  These include headache, dizziness, photophobia and phonophobia.  Recommended patient wait until they are asymptomatic before returning to activity and then progressing from light to moderate to high intensity activity with finally full contact as long as they are asymptomatic.  Continue ibuprofen and Tylenol as needed - Zofran sent in for nausea.   Other orders -     Ondansetron HCl; Take 1 tablet (4 mg total) by mouth every 8 (eight) hours as needed for nausea or vomiting.  Dispense: 20 tablet; Refill: 0     Return if symptoms worsen or fail to improve.  Sandre Kitty, MD

## 2022-11-26 ENCOUNTER — Encounter: Payer: Self-pay | Admitting: Family Medicine

## 2022-12-03 ENCOUNTER — Ambulatory Visit: Payer: 59 | Attending: Rheumatology | Admitting: Rheumatology

## 2022-12-03 ENCOUNTER — Telehealth: Payer: Self-pay

## 2022-12-03 ENCOUNTER — Encounter: Payer: Self-pay | Admitting: Rheumatology

## 2022-12-03 VITALS — BP 130/82 | HR 85 | Resp 16 | Ht 63.0 in | Wt 243.2 lb

## 2022-12-03 DIAGNOSIS — M25571 Pain in right ankle and joints of right foot: Secondary | ICD-10-CM

## 2022-12-03 DIAGNOSIS — M2550 Pain in unspecified joint: Secondary | ICD-10-CM | POA: Diagnosis not present

## 2022-12-03 DIAGNOSIS — F5104 Psychophysiologic insomnia: Secondary | ICD-10-CM | POA: Diagnosis not present

## 2022-12-03 DIAGNOSIS — F331 Major depressive disorder, recurrent, moderate: Secondary | ICD-10-CM

## 2022-12-03 DIAGNOSIS — M25562 Pain in left knee: Secondary | ICD-10-CM

## 2022-12-03 DIAGNOSIS — E559 Vitamin D deficiency, unspecified: Secondary | ICD-10-CM

## 2022-12-03 DIAGNOSIS — D6851 Activated protein C resistance: Secondary | ICD-10-CM

## 2022-12-03 DIAGNOSIS — Z86718 Personal history of other venous thrombosis and embolism: Secondary | ICD-10-CM

## 2022-12-03 DIAGNOSIS — G90A Postural orthostatic tachycardia syndrome (POTS): Secondary | ICD-10-CM

## 2022-12-03 DIAGNOSIS — G8929 Other chronic pain: Secondary | ICD-10-CM

## 2022-12-03 DIAGNOSIS — H53002 Unspecified amblyopia, left eye: Secondary | ICD-10-CM

## 2022-12-03 DIAGNOSIS — M25561 Pain in right knee: Secondary | ICD-10-CM

## 2022-12-03 DIAGNOSIS — F411 Generalized anxiety disorder: Secondary | ICD-10-CM

## 2022-12-03 DIAGNOSIS — F410 Panic disorder [episodic paroxysmal anxiety] without agoraphobia: Secondary | ICD-10-CM

## 2022-12-03 DIAGNOSIS — Z9151 Personal history of suicidal behavior: Secondary | ICD-10-CM

## 2022-12-03 DIAGNOSIS — R5383 Other fatigue: Secondary | ICD-10-CM

## 2022-12-03 DIAGNOSIS — R002 Palpitations: Secondary | ICD-10-CM

## 2022-12-03 DIAGNOSIS — F909 Attention-deficit hyperactivity disorder, unspecified type: Secondary | ICD-10-CM

## 2022-12-03 DIAGNOSIS — R7989 Other specified abnormal findings of blood chemistry: Secondary | ICD-10-CM

## 2022-12-03 DIAGNOSIS — M25572 Pain in left ankle and joints of left foot: Secondary | ICD-10-CM

## 2022-12-03 NOTE — Patient Instructions (Signed)
Exercises for Chronic Knee Pain Chronic knee pain is pain that lasts longer than 3 months. For most people with chronic knee pain, exercise and weight loss is an important part of treatment. Your health care provider may want you to focus on: Making the muscles that support your knee stronger. This can take pressure off your knee and reduce pain. Preventing knee stiffness. How far you can move your knee, keeping it there or making it farther. Losing weight (if this applies) to take pressure off your knee, lower your risk for injury, and make it easier for you to exercise. Your provider will help you make an exercise program that fits your needs and physical abilities. Below are simple, low-impact exercises you can do at home. Ask your provider or physical therapist how often you should do your exercise program and how many times to repeat each exercise. General safety tips  Get your provider's approval before doing any exercises. Start slowly and stop any time you feel pain. Do not exercise if your knee pain is flaring up. Warm up first. Stretching a cold muscle can cause an injury. Do 5-10 minutes of easy movement or light stretching before beginning your exercises. Do 5-10 minutes of low-impact activity (like walking or cycling) before starting strengthening exercises. Contact your provider any time you have pain during or after exercising. Exercise can cause discomfort but should not be painful. It is normal to be a little stiff or sore after exercising. Stretching and range-of-motion exercises Front thigh stretch  Stand up straight and support your body by holding on to a chair or resting one hand on a wall. With your legs straight and close together, bend one knee to lift your heel up toward your butt. Using one hand for support, grab your ankle with your free hand. Pull your foot up closer toward your butt to feel the stretch in front of your thigh. Hold the stretch for 30  seconds. Repeat __________ times. Complete this exercise __________ times a day. Back thigh stretch  Sit on the floor with your back straight and your legs out straight in front of you. Place the palms of your hands on the floor and slide them toward your feet as you bend at the hip. Try to touch your nose to your knees and feel the stretch in the back of your thighs. Hold for 30 seconds. Repeat __________ times. Complete this exercise __________ times a day. Calf stretch  Stand facing a wall. Place the palms of your hands flat against the wall, arms extended, and lean slightly against the wall. Get into a lunge position with one leg bent at the knee and the other leg stretched out straight behind you. Keep both feet facing the wall and increase the bend in your knee while keeping the heel of the other leg flat on the ground. You should feel the stretch in your calf. Hold for 30 seconds. Repeat __________ times. Complete this exercise __________ times a day. Strengthening exercises Straight leg lift  Lie on your back with one knee bent and the other leg out straight. Slowly lift the straight leg without bending the knee. Lift until your foot is about 12 inches (30 cm) off the floor. Hold for 3-5 seconds and slowly lower your leg. Repeat __________ times. Complete this exercise __________ times a day. Single leg dip  Stand between two chairs and put both hands on the backs of the chairs for support. Extend one leg out straight with your body   weight resting on the heel of the standing leg. Slowly bend your standing knee to dip your body to the level that is comfortable for you. Hold for 3-5 seconds. Repeat __________ times. Complete this exercise __________ times a day. Hamstring curls  Stand straight, knees close together, facing the back of a chair. Hold on to the back of a chair with both hands. Keep one leg straight. Bend the other knee while bringing the heel up toward the butt  until the knee is bent at a 90-degree angle (right angle). Hold for 3-5 seconds. Repeat __________ times. Complete this exercise __________ times a day. Wall squat  Stand straight with your back, hips, and head against a wall. Step forward one foot at a time with your back still against the wall. Your feet should be 2 feet (61 cm) from the wall at shoulder width. Keeping your back, hips, and head against the wall, slide down the wall to as close to a sitting position as you can get. Hold for 5-10 seconds, then slowly slide back up. Repeat __________ times. Complete this exercise __________ times a day. Step-ups  Stand in front of a sturdy platform or stool that is about 6 inches (15 cm) high. Slowly step up with your left / right foot, keeping your knee in line with your hip and foot. Do not let your knee bend so far that you cannot see your toes. Hold on to a chair for balance, but do not use it for support. Slowly unlock your knee and lower yourself to the starting position. Repeat __________ times. Complete this exercise __________ times a day. Contact a health care provider if: Your exercises cause pain. Your pain is worse after you exercise. Your pain prevents you from doing your exercises. This information is not intended to replace advice given to you by your health care provider. Make sure you discuss any questions you have with your health care provider. Document Revised: 01/27/2022 Document Reviewed: 01/27/2022 Elsevier Patient Education  2024 [employer]  

## 2022-12-03 NOTE — Telephone Encounter (Signed)
Patient was seen as a new patient today and per Dr. Corliss Skains, if labs are normal/negative, patient does not need a new patient follow up.

## 2022-12-04 NOTE — Progress Notes (Signed)
Uric acid normal, sed rate normal, vitamin D normal, CK normal.  ANA, anti-CCP and RF are pending.

## 2022-12-06 NOTE — Progress Notes (Signed)
Rheumatoid factor negative.  Patient had no inflammation in the joints at the last office visit.  All autoimmune workup is negative.  Labs do not show inflammation.  It is okay to cancel the follow-up appointment if patient is in agreement.

## 2022-12-07 LAB — ANA: Anti Nuclear Antibody (ANA): NEGATIVE

## 2022-12-07 LAB — SEDIMENTATION RATE: Sed Rate: 14 mm/h (ref 0–20)

## 2022-12-07 LAB — VITAMIN D 25 HYDROXY (VIT D DEFICIENCY, FRACTURES): Vit D, 25-Hydroxy: 61 ng/mL (ref 30–100)

## 2022-12-07 LAB — CK: Total CK: 48 U/L (ref 29–143)

## 2022-12-07 LAB — CYCLIC CITRUL PEPTIDE ANTIBODY, IGG: Cyclic Citrullin Peptide Ab: <16 U

## 2022-12-07 LAB — RHEUMATOID FACTOR: Rheumatoid fact SerPl-aCnc: <10 [IU]/mL (ref <14)

## 2022-12-07 LAB — URIC ACID: Uric Acid, Serum: 6.3 mg/dL (ref 2.5–7.0)

## 2022-12-07 NOTE — Telephone Encounter (Signed)
Patient's new patient follow up has been canceled

## 2022-12-09 ENCOUNTER — Encounter: Payer: Self-pay | Admitting: Family Medicine

## 2022-12-09 ENCOUNTER — Telehealth (INDEPENDENT_AMBULATORY_CARE_PROVIDER_SITE_OTHER): Payer: 59 | Admitting: Family Medicine

## 2022-12-09 ENCOUNTER — Other Ambulatory Visit (HOSPITAL_COMMUNITY): Payer: Self-pay

## 2022-12-09 ENCOUNTER — Other Ambulatory Visit: Payer: Self-pay

## 2022-12-09 DIAGNOSIS — F909 Attention-deficit hyperactivity disorder, unspecified type: Secondary | ICD-10-CM

## 2022-12-09 DIAGNOSIS — F411 Generalized anxiety disorder: Secondary | ICD-10-CM | POA: Diagnosis not present

## 2022-12-09 DIAGNOSIS — F331 Major depressive disorder, recurrent, moderate: Secondary | ICD-10-CM

## 2022-12-09 DIAGNOSIS — F5104 Psychophysiologic insomnia: Secondary | ICD-10-CM | POA: Diagnosis not present

## 2022-12-09 MED ORDER — ATOMOXETINE HCL 10 MG PO CAPS
10.0000 mg | ORAL_CAPSULE | Freq: Every day | ORAL | 1 refills | Status: DC
  Filled 2022-12-09: qty 60, 60d supply, fill #0
  Filled 2023-02-02: qty 60, 60d supply, fill #1

## 2022-12-09 NOTE — Assessment & Plan Note (Signed)
GAD-7 score improved to 7.  Continue Zoloft 50 mg daily, Wellbutrin 150 mg daily, and augment with quetiapine 50 mg daily at bedtime for anxiety, depression, and insomnia.  Will continue to monitor.

## 2022-12-09 NOTE — Assessment & Plan Note (Signed)
PHQ-9 score fairly stable at 8. Continue Zoloft 50 mg daily, Wellbutrin 150 mg daily.  Continue quetiapine 50 mg daily at bedtime for anxiety, depression, and insomnia.  Will continue to monitor.

## 2022-12-09 NOTE — Progress Notes (Signed)
Virtual Visit via Video Note  I connected with Erin Montgomery on 12/09/22 at  9:30 AM EST by a video enabled telemedicine application and verified that I am speaking with the correct person using two identifiers.  Patient Location: Home Provider Location: Office/clinic  I discussed the limitations, risks, security, and privacy concerns of performing an evaluation and management service by video and the availability of in person appointments. I also discussed with the patient that there may be a patient responsible charge related to this service. The patient expressed understanding and agreed to proceed.    Subjective   Patient ID: Erin Montgomery, adult    DOB: March 12, 2000  Age: 22 y.o. MRN: [id number]  Chief Complaint  Patient presents with   Anxiety   Depression    HPI Erin Montgomery is a 22 y.o. adult presenting today for follow up of sleep and mood. They have also recently been diagnosed with ADHD and autism and would like to discuss options for management.  They see a therapist and underwent evaluation for ADHD and autism.  They continue to follow with therapy but are interested in medication options for ADHD to improve concentration.  They have not done any research and are open to discussing all options. Insomnia: Seroquel 50 mg nightly has provided a huge improvement in sleep.  They note that they must take it exactly 30 minutes before they want to fall asleep because it "knocks them out" at that point.  They are very happy with this current regimen.  Even there fitness app on their watch has recorded a significant increase in sleep over the past month. Mood: Patient is here to follow up for depression and anxiety, currently managing with Zoloft 50 mg daily and bupropion 150 mg daily. Taking medication without side effects, reports excellent compliance with treatment. Denies mood changes or SI/HI. Erin Montgomery feels anxiety is improved since last visit.     12/09/2022    9:19 AM 11/24/2022     2:41 PM 10/27/2022    9:23 AM  Depression screen PHQ 2/9  Decreased Interest 1 1 1   Down, Depressed, Hopeless 2 1 1   PHQ - 2 Score 3 2 2   Altered sleeping 0 0 3  Tired, decreased energy 1 1 2   Change in appetite 1 1 1   Feeling bad or failure about yourself  0 0 1  Trouble concentrating 3 1 1   Moving slowly or fidgety/restless 0 0 1  Suicidal thoughts 0 0 0  PHQ-9 Score 8 5 11   Difficult doing work/chores Somewhat difficult Not difficult at all Not difficult at all       12/09/2022    9:21 AM 10/27/2022    9:25 AM 08/19/2022    1:31 PM 03/16/2022    2:54 PM  GAD 7 : Generalized Anxiety Score  Nervous, Anxious, on Edge 1 3 1 1   Control/stop worrying 1 2 1 1   Worry too much - different things 1 2 1 1   Trouble relaxing 1 2 1 1   Restless 1 2 1 1   Easily annoyed or irritable 1 1 2 1   Afraid - awful might happen 1 3 2 1   Total GAD 7 Score 7 15 9 7   Anxiety Difficulty Somewhat difficult Somewhat difficult Very difficult     ROS Negative unless otherwise noted in HPI  Outpatient Medications Prior to Visit  Medication Sig   buPROPion (WELLBUTRIN XL) 150 MG 24 hr tablet Take 1 tablet (150 mg total) by mouth  daily.   ketoconazole (NIZORAL) 2 % cream Apply to affected area twice daily for 2-3 weeks when flaring   nystatin (MYCOSTATIN/NYSTOP) powder Apply to affected area 3 times a day for up to 7 days   ondansetron (ZOFRAN) 4 MG tablet Take 1 tablet (4 mg total) by mouth every 8 (eight) hours as needed for nausea or vomiting.   QUEtiapine (SEROQUEL) 50 MG tablet Take 1 tablet (50 mg total) by mouth at bedtime. May increase to 150 mg (3 tablets) if needed after 3 days.   sertraline (ZOLOFT) 50 MG tablet Take 1 tablet (50 mg total) by mouth daily.   Vitamin D, Ergocalciferol, (DRISDOL) 1.25 MG (50000 UNIT) CAPS capsule Take 1 capsule by mouth every 7 days.   No facility-administered medications prior to visit.     Objective:     Physical Exam General: Speaking clearly in complete  sentences without any shortness of breath.  Alert and oriented x3.  Normal judgment. No apparent acute distress.    Assessment & Plan:  Attention deficit hyperactivity disorder (ADHD), unspecified ADHD type Assessment & Plan: Discussed ADHD medications that fall in both stimulant and nonstimulant categories.  Patient notes that since they still do not have an answer as to what is causing some of their symptoms and the possibility of POTS, they would like to avoid anything that has a high likelihood of causing fluctuations in blood pressure and heart rate.  They also do not want to take anything that might make it difficult to sleep again.  They would like to try a nonstimulant medication first.  Avoid guanfacine and clonidine due to risk of hypotension.  Start atomoxetine 10 mg daily which is the lowest dose.  We discussed that this can be increased in the future if needed to improve efficacy.  Will continue to monitor.  Orders: -     Atomoxetine HCl; Take 1 capsule (10 mg total) by mouth daily.  Dispense: 60 capsule; Refill: 1  Generalized anxiety disorder Assessment & Plan: GAD-7 score improved to 7.  Continue Zoloft 50 mg daily, Wellbutrin 150 mg daily, and augment with quetiapine 50 mg daily at bedtime for anxiety, depression, and insomnia.  Will continue to monitor.   Moderate episode of recurrent major depressive disorder (HCC) Assessment & Plan: PHQ-9 score fairly stable at 8. Continue Zoloft 50 mg daily, Wellbutrin 150 mg daily.  Continue quetiapine 50 mg daily at bedtime for anxiety, depression, and insomnia.  Will continue to monitor.   Psychophysiological insomnia Assessment & Plan: Well-controlled, stable.  Continue quetiapine 50 mg daily 30 minutes before bedtime.  Will continue to monitor.     Return in about 7 weeks (around 01/27/2023) for follow-up for mood, ADHD, sleep.   I discussed the assessment and treatment plan with the patient. The patient was provided an  opportunity to ask questions, and all were answered. The patient agreed with the plan and demonstrated an understanding of the instructions.   The patient was advised to call back or seek an in-person evaluation if the symptoms worsen or if the condition fails to improve as anticipated.  The above assessment and management plan was discussed with the patient. The patient verbalized understanding of and has agreed to the management plan.   Melida Quitter, PA

## 2022-12-09 NOTE — Assessment & Plan Note (Signed)
Discussed ADHD medications that fall in both stimulant and nonstimulant categories.  Patient notes that since they still do not have an answer as to what is causing some of their symptoms and the possibility of POTS, they would like to avoid anything that has a high likelihood of causing fluctuations in blood pressure and heart rate.  They also do not want to take anything that might make it difficult to sleep again.  They would like to try a nonstimulant medication first.  Avoid guanfacine and clonidine due to risk of hypotension.  Start atomoxetine 10 mg daily which is the lowest dose.  We discussed that this can be increased in the future if needed to improve efficacy.  Will continue to monitor.

## 2022-12-09 NOTE — Assessment & Plan Note (Signed)
Well-controlled, stable.  Continue quetiapine 50 mg daily 30 minutes before bedtime.  Will continue to monitor.

## 2022-12-26 ENCOUNTER — Other Ambulatory Visit (HOSPITAL_COMMUNITY): Payer: Self-pay

## 2022-12-28 DIAGNOSIS — F411 Generalized anxiety disorder: Secondary | ICD-10-CM | POA: Diagnosis not present

## 2023-01-01 ENCOUNTER — Other Ambulatory Visit: Payer: Self-pay | Admitting: Medical Genetics

## 2023-01-06 ENCOUNTER — Ambulatory Visit: Payer: 59 | Admitting: Rheumatology

## 2023-01-21 ENCOUNTER — Other Ambulatory Visit: Payer: Self-pay

## 2023-01-21 ENCOUNTER — Other Ambulatory Visit (HOSPITAL_COMMUNITY): Payer: Self-pay

## 2023-01-21 ENCOUNTER — Other Ambulatory Visit: Payer: Self-pay | Admitting: Family Medicine

## 2023-01-21 DIAGNOSIS — F5104 Psychophysiologic insomnia: Secondary | ICD-10-CM

## 2023-01-21 MED ORDER — QUETIAPINE FUMARATE 50 MG PO TABS
50.0000 mg | ORAL_TABLET | Freq: Every day | ORAL | 1 refills | Status: DC
Start: 2023-01-21 — End: 2023-03-04
  Filled 2023-01-21: qty 90, 90d supply, fill #0
  Filled 2023-03-02: qty 90, 90d supply, fill #1

## 2023-01-29 ENCOUNTER — Encounter: Payer: Self-pay | Admitting: Family Medicine

## 2023-01-29 ENCOUNTER — Other Ambulatory Visit (HOSPITAL_COMMUNITY): Payer: Self-pay

## 2023-01-29 ENCOUNTER — Other Ambulatory Visit: Payer: Self-pay

## 2023-01-29 ENCOUNTER — Ambulatory Visit (INDEPENDENT_AMBULATORY_CARE_PROVIDER_SITE_OTHER): Payer: 59 | Admitting: Family Medicine

## 2023-01-29 VITALS — BP 138/80 | HR 86 | Temp 97.7°F | Ht 63.0 in | Wt 234.0 lb

## 2023-01-29 DIAGNOSIS — F411 Generalized anxiety disorder: Secondary | ICD-10-CM

## 2023-01-29 DIAGNOSIS — F331 Major depressive disorder, recurrent, moderate: Secondary | ICD-10-CM | POA: Diagnosis not present

## 2023-01-29 DIAGNOSIS — F5104 Psychophysiologic insomnia: Secondary | ICD-10-CM | POA: Diagnosis not present

## 2023-01-29 DIAGNOSIS — F909 Attention-deficit hyperactivity disorder, unspecified type: Secondary | ICD-10-CM

## 2023-01-29 MED ORDER — BUPROPION HCL ER (XL) 150 MG PO TB24
150.0000 mg | ORAL_TABLET | Freq: Every day | ORAL | 0 refills | Status: DC
  Filled 2023-01-29 – 2023-03-02 (×2): qty 90, 90d supply, fill #0

## 2023-01-29 MED ORDER — SERTRALINE HCL 50 MG PO TABS
50.0000 mg | ORAL_TABLET | Freq: Every day | ORAL | 0 refills | Status: DC
  Filled 2023-01-29: qty 90, 90d supply, fill #0

## 2023-01-29 NOTE — Assessment & Plan Note (Signed)
 Continue Zoloft 50 mg daily, Wellbutrin 150 mg daily, and augment with quetiapine 50 mg daily at bedtime for anxiety, depression, and insomnia.  Will continue to monitor.

## 2023-01-29 NOTE — Patient Instructions (Addendum)
 STOP taking the Strattera  (atomoxetine ) for the next 3 weeks. After that, send me a MyChart message to let me know if your appetite and sleep are any better. I will also be curious to know if there is any difference in focus for you in that time period.  I have sent refills of the medications it looks like you are due to pick up so you have enough for the next couple of months as you get established in [place]. You can cancel your follow up appointment with me once you have someone there to take over your prescriptions!  I wish you the absolute best and hope that married life and the move are amazing for you both!

## 2023-01-29 NOTE — Assessment & Plan Note (Signed)
 Continue Zoloft 50 mg daily, Wellbutrin 150 mg daily.  Continue quetiapine 50 mg daily at bedtime for anxiety, depression, and insomnia.  Will continue to monitor.

## 2023-01-29 NOTE — Assessment & Plan Note (Signed)
 Continue quetiapine 50 mg daily 30 minutes before bedtime trial of discontinuing atomoxetine.  Will continue to monitor.

## 2023-01-29 NOTE — Progress Notes (Signed)
 Established Patient Office Visit  Subjective   Patient ID: Erin Montgomery, adult    DOB: 17-Apr-2000  Age: 23 y.o. MRN: [id number]  Chief Complaint  Patient presents with   Follow-up    HPI Erin Montgomery is a 23 y.o. adult presenting today for follow up of ADHD, insomnia, mood.  They will be getting married next week and then moving to [facility] and are very excited! ADHD: At last appointment, started atomoxetine  10 mg daily, wanted to avoid guanfacine/clonidine due to risk of hypotension as well as stimulants due to risk of fluctuations in blood pressure and heart rate.  They are not sure that the Strattera  has offered any improvement in focus, they have not noticed any significant changes though they do state that their brain has been quieter.  They have noted that their appetite has decreased and they have lost a significant amount of weight since starting the Strattera  which is not their goal to do.  They also started developing trouble sleeping, typically waking up either at 3 AM or at noon.  They are not sure if this is due to medication changes, change in schedule now that they do not have a job, or the holiday season/wedding planning. Insomnia: KJ previously reported that Seroquel  made a huge improvement in sleep, taking quetiapine  50 mg daily 30 minutes before bedtime. Mood: Patient is here to follow up for depression and anxiety, currently managing with Zoloft  50 mg daily, Wellbutrin  150 mg daily, augmenting with quetiapine  50 mg daily at bedtime. Taking medication without side effects, reports excellent compliance with treatment. Denies mood changes or SI/HI. KJ feels mood is stable since last visit.      01/29/2023    9:27 AM 12/09/2022    9:19 AM 11/24/2022    2:41 PM  Depression screen PHQ 2/9  Decreased Interest 1 1 1   Down, Depressed, Hopeless 1 2 1   PHQ - 2 Score 2 3 2   Altered sleeping 2 0 0  Tired, decreased energy 2 1 1   Change in appetite 3 1 1   Feeling bad or failure  about yourself  1 0 0  Trouble concentrating 2 3 1   Moving slowly or fidgety/restless 2 0 0  Suicidal thoughts 0 0 0  PHQ-9 Score 14 8 5   Difficult doing work/chores Very difficult Somewhat difficult Not difficult at all       01/29/2023    9:27 AM 12/09/2022    9:21 AM 10/27/2022    9:25 AM 08/19/2022    1:31 PM  GAD 7 : Generalized Anxiety Score  Nervous, Anxious, on Edge 2 1 3 1   Control/stop worrying 3 1 2 1   Worry too much - different things 3 1 2 1   Trouble relaxing 3 1 2 1   Restless 3 1 2 1   Easily annoyed or irritable 3 1 1 2   Afraid - awful might happen 2 1 3 2   Total GAD 7 Score 19 7 15 9   Anxiety Difficulty Very difficult Somewhat difficult Somewhat difficult Very difficult    Outpatient Medications Prior to Visit  Medication Sig   atomoxetine  (STRATTERA ) 10 MG capsule Take 1 capsule (10 mg total) by mouth daily.   ketoconazole  (NIZORAL ) 2 % cream Apply to affected area twice daily for 2-3 weeks when flaring   nystatin  (MYCOSTATIN /NYSTOP ) powder Apply to affected area 3 times a day for up to 7 days   ondansetron  (ZOFRAN ) 4 MG tablet Take 1 tablet (4 mg total) by mouth every  8 (eight) hours as needed for nausea or vomiting.   QUEtiapine  (SEROQUEL ) 50 MG tablet Take 1 tablet (50 mg total) by mouth at bedtime. May increase to 150 mg (3 tablets) if needed after 3 days.   Vitamin D , Ergocalciferol , (DRISDOL ) 1.25 MG (50000 UNIT) CAPS capsule Take 1 capsule by mouth every 7 days.   [DISCONTINUED] buPROPion  (WELLBUTRIN  XL) 150 MG 24 hr tablet Take 1 tablet (150 mg total) by mouth daily.   [DISCONTINUED] sertraline  (ZOLOFT ) 50 MG tablet Take 1 tablet (50 mg total) by mouth daily.   No facility-administered medications prior to visit.    ROS Negative unless otherwise noted in HPI   Objective:     BP 138/80   Pulse 86   Temp 97.7 F (36.5 C) (Temporal)   Ht 5' 3 (1.6 m)   Wt 234 lb (106.1 kg)   SpO2 100%   BMI 41.45 kg/m   Physical Exam Constitutional:       General: KJ is not in acute distress.    Appearance: Normal appearance.  HENT:     Head: Normocephalic and atraumatic.  Pulmonary:     Effort: Pulmonary effort is normal. No respiratory distress.  Musculoskeletal:     Cervical back: Normal range of motion.  Neurological:     General: No focal deficit present.     Mental Status: Erin Montgomery is alert and oriented to person, place, and time. Mental status is at baseline.  Psychiatric:        Mood and Affect: Mood normal.        Thought Content: Thought content normal.        Judgment: Judgment normal.      Assessment & Plan:  Attention deficit hyperactivity disorder (ADHD), unspecified ADHD type Assessment & Plan: Due to possible side effects of difficulty sleeping and decreased appetite, stop atomoxetine  for the next 3 weeks as a trial.  At that point, recommend sending MyChart message to update PCP regarding any improvements.  Recommend establishing care in [facility] for medication management, particularly if interested in considering stimulant medications or guanfacine/clonidine.  Orders: -     Comprehensive metabolic panel; Future  Moderate episode of recurrent major depressive disorder (HCC) Assessment & Plan: Continue Zoloft  50 mg daily, Wellbutrin  150 mg daily.  Continue quetiapine  50 mg daily at bedtime for anxiety, depression, and insomnia.  Will continue to monitor.  Orders: -     Sertraline  HCl; Take 1 tablet (50 mg total) by mouth daily.  Dispense: 90 tablet; Refill: 0 -     buPROPion  HCl ER (XL); Take 1 tablet (150 mg total) by mouth daily.  Dispense: 90 tablet; Refill: 0  Generalized anxiety disorder Assessment & Plan: Continue Zoloft  50 mg daily, Wellbutrin  150 mg daily, and augment with quetiapine  50 mg daily at bedtime for anxiety, depression, and insomnia.  Will continue to monitor.  Orders: -     Sertraline  HCl; Take 1 tablet (50 mg total) by mouth daily.  Dispense: 90 tablet; Refill: 0  Psychophysiological  insomnia Assessment & Plan: Continue quetiapine  50 mg daily 30 minutes before bedtime trial of discontinuing atomoxetine .  Will continue to monitor.     Return in about 3 months (around 04/29/2023) for follow-up for ADHD, video.  Schedule follow-up appointment but may cancel if established with new provider in [place] before then.   Joesph DELENA Sear, PA

## 2023-01-29 NOTE — Assessment & Plan Note (Signed)
 Due to possible side effects of difficulty sleeping and decreased appetite, stop atomoxetine  for the next 3 weeks as a trial.  At that point, recommend sending MyChart message to update PCP regarding any improvements.  Recommend establishing care in [facility] for medication management, particularly if interested in considering stimulant medications or guanfacine/clonidine.

## 2023-01-30 LAB — COMPREHENSIVE METABOLIC PANEL
ALT: 33 [IU]/L — ABNORMAL HIGH (ref 0–32)
AST: 26 [IU]/L (ref 0–40)
Albumin: 4.8 g/dL (ref 4.0–5.0)
Alkaline Phosphatase: 90 [IU]/L (ref 44–121)
BUN/Creatinine Ratio: 15 (ref 9–23)
BUN: 15 mg/dL (ref 6–20)
Bilirubin Total: 0.8 mg/dL (ref 0.0–1.2)
CO2: 24 mmol/L (ref 20–29)
Calcium: 9.8 mg/dL (ref 8.7–10.2)
Chloride: 101 mmol/L (ref 96–106)
Creatinine, Ser: 0.97 mg/dL (ref 0.57–1.00)
Globulin, Total: 2.6 g/dL (ref 1.5–4.5)
Glucose: 85 mg/dL (ref 70–99)
Potassium: 4 mmol/L (ref 3.5–5.2)
Sodium: 141 mmol/L (ref 134–144)
Total Protein: 7.4 g/dL (ref 6.0–8.5)
eGFR: 85 mL/min/{1.73_m2} (ref >59)

## 2023-02-01 DIAGNOSIS — F411 Generalized anxiety disorder: Secondary | ICD-10-CM | POA: Diagnosis not present

## 2023-02-02 ENCOUNTER — Other Ambulatory Visit: Payer: Self-pay

## 2023-02-12 ENCOUNTER — Other Ambulatory Visit: Payer: Self-pay | Admitting: Family Medicine

## 2023-02-12 DIAGNOSIS — E559 Vitamin D deficiency, unspecified: Secondary | ICD-10-CM

## 2023-03-02 ENCOUNTER — Other Ambulatory Visit: Payer: Self-pay

## 2023-03-02 ENCOUNTER — Other Ambulatory Visit: Payer: Self-pay | Admitting: Family Medicine

## 2023-03-02 ENCOUNTER — Other Ambulatory Visit (HOSPITAL_COMMUNITY): Payer: Self-pay

## 2023-03-02 DIAGNOSIS — F909 Attention-deficit hyperactivity disorder, unspecified type: Secondary | ICD-10-CM

## 2023-03-02 DIAGNOSIS — F331 Major depressive disorder, recurrent, moderate: Secondary | ICD-10-CM

## 2023-03-02 DIAGNOSIS — F411 Generalized anxiety disorder: Secondary | ICD-10-CM

## 2023-03-02 DIAGNOSIS — E559 Vitamin D deficiency, unspecified: Secondary | ICD-10-CM

## 2023-03-02 MED ORDER — SERTRALINE HCL 50 MG PO TABS
50.0000 mg | ORAL_TABLET | Freq: Every day | ORAL | 0 refills | Status: AC
  Filled 2023-03-02: qty 90, 90d supply, fill #0

## 2023-03-02 MED ORDER — ATOMOXETINE HCL 10 MG PO CAPS
10.0000 mg | ORAL_CAPSULE | Freq: Every day | ORAL | 1 refills | Status: AC
  Filled 2023-03-02: qty 60, 60d supply, fill #0

## 2023-03-02 MED ORDER — ONDANSETRON HCL 4 MG PO TABS
4.0000 mg | ORAL_TABLET | Freq: Three times a day (TID) | ORAL | 0 refills | Status: AC | PRN
  Filled 2023-03-02: qty 20, 7d supply, fill #0

## 2023-03-02 MED ORDER — VITAMIN D (ERGOCALCIFEROL) 1.25 MG (50000 UNIT) PO CAPS
50000.0000 [IU] | ORAL_CAPSULE | ORAL | 1 refills | Status: AC
  Filled 2023-03-02: qty 12, 84d supply, fill #0

## 2023-03-04 ENCOUNTER — Other Ambulatory Visit: Payer: Self-pay | Admitting: Family Medicine

## 2023-03-04 ENCOUNTER — Encounter: Payer: Self-pay | Admitting: Family Medicine

## 2023-03-04 DIAGNOSIS — F5104 Psychophysiologic insomnia: Secondary | ICD-10-CM

## 2023-03-04 DIAGNOSIS — F331 Major depressive disorder, recurrent, moderate: Secondary | ICD-10-CM

## 2023-03-04 MED ORDER — BUPROPION HCL ER (XL) 150 MG PO TB24: 150.0000 mg | ORAL_TABLET | Freq: Every day | ORAL | 0 refills | Status: AC

## 2023-03-04 MED ORDER — QUETIAPINE FUMARATE 50 MG PO TABS: 50.0000 mg | ORAL_TABLET | Freq: Every day | ORAL | 1 refills | Status: DC

## 2023-03-04 NOTE — Telephone Encounter (Signed)
 Last Fill: Seroquel : 01/21/23     Wellbutrin : 01/29/23  Last OV: 01/29/23 Next OV: 04/29/23  Routing to provider for review/authorization.

## 2023-03-04 NOTE — Telephone Encounter (Signed)
 Copied from CRM [id number]. Topic: Clinical - Medication Refill >> Mar 04, 2023 10:05 AM Carmell SAUNDERS wrote: Most Recent Primary Care Visit:  Provider: WALLACE SEARCH A  Department: [facility]  Visit Type: OFFICE VISIT  Date: 01/29/2023  Medication: QUEtiapine  (SEROQUEL ) 50 MG tablet and buPROPion  (WELLBUTRIN  XL) 150 MG 24 hr tablet  Has the patient contacted their pharmacy? No. She stated she moved and needed new Rxs. She was advised to contact the pharmacy. WELLBUTRIN  is out of refills.  Is this the correct pharmacy for this prescription? Yes If no, delete pharmacy and type the correct one.  This is the patient's preferred pharmacy:  [facility] - [facility] [address] [place] [address] Phone: [phone] Fax: [phone]  Has the prescription been filled recently? Yes  Is the patient out of the medication? No  Has the patient been seen for an appointment in the last year OR does the patient have an upcoming appointment? Yes  Can we respond through MyChart? Yes  Agent: Please be advised that Rx refills may take up to 3 business days. We ask that you follow-up with your pharmacy.

## 2023-03-09 DIAGNOSIS — Z01419 Encounter for gynecological examination (general) (routine) without abnormal findings: Secondary | ICD-10-CM | POA: Diagnosis not present

## 2023-03-11 DIAGNOSIS — F909 Attention-deficit hyperactivity disorder, unspecified type: Secondary | ICD-10-CM | POA: Diagnosis not present

## 2023-03-11 DIAGNOSIS — Z0000 Encounter for general adult medical examination without abnormal findings: Secondary | ICD-10-CM | POA: Diagnosis not present

## 2023-03-11 DIAGNOSIS — M25561 Pain in right knee: Secondary | ICD-10-CM | POA: Diagnosis not present

## 2023-03-11 DIAGNOSIS — E559 Vitamin D deficiency, unspecified: Secondary | ICD-10-CM | POA: Diagnosis not present

## 2023-03-11 DIAGNOSIS — Z711 Person with feared health complaint in whom no diagnosis is made: Secondary | ICD-10-CM | POA: Diagnosis not present

## 2023-03-11 DIAGNOSIS — R198 Other specified symptoms and signs involving the digestive system and abdomen: Secondary | ICD-10-CM | POA: Diagnosis not present

## 2023-03-11 DIAGNOSIS — H53032 Strabismic amblyopia, left eye: Secondary | ICD-10-CM | POA: Diagnosis not present

## 2023-03-11 DIAGNOSIS — Z6841 Body Mass Index (BMI) 40.0 and over, adult: Secondary | ICD-10-CM | POA: Diagnosis not present

## 2023-03-11 DIAGNOSIS — F418 Other specified anxiety disorders: Secondary | ICD-10-CM | POA: Diagnosis not present

## 2023-03-11 DIAGNOSIS — E66813 Obesity, class 3: Secondary | ICD-10-CM | POA: Diagnosis not present

## 2023-03-11 DIAGNOSIS — D6851 Activated protein C resistance: Secondary | ICD-10-CM | POA: Diagnosis not present

## 2023-03-11 DIAGNOSIS — M25562 Pain in left knee: Secondary | ICD-10-CM | POA: Diagnosis not present

## 2023-03-23 DIAGNOSIS — L299 Pruritus, unspecified: Secondary | ICD-10-CM | POA: Diagnosis not present

## 2023-03-23 DIAGNOSIS — L509 Urticaria, unspecified: Secondary | ICD-10-CM | POA: Diagnosis not present

## 2023-03-23 DIAGNOSIS — R197 Diarrhea, unspecified: Secondary | ICD-10-CM | POA: Diagnosis not present

## 2023-03-23 DIAGNOSIS — R1110 Vomiting, unspecified: Secondary | ICD-10-CM | POA: Diagnosis not present

## 2023-03-24 ENCOUNTER — Ambulatory Visit: Payer: 59 | Admitting: Dermatology

## 2023-03-25 DIAGNOSIS — Z0000 Encounter for general adult medical examination without abnormal findings: Secondary | ICD-10-CM | POA: Diagnosis not present

## 2023-03-25 DIAGNOSIS — F418 Other specified anxiety disorders: Secondary | ICD-10-CM | POA: Diagnosis not present

## 2023-03-25 DIAGNOSIS — E559 Vitamin D deficiency, unspecified: Secondary | ICD-10-CM | POA: Diagnosis not present

## 2023-03-25 DIAGNOSIS — R000 Tachycardia, unspecified: Secondary | ICD-10-CM | POA: Diagnosis not present

## 2023-04-09 DIAGNOSIS — F840 Autistic disorder: Secondary | ICD-10-CM | POA: Diagnosis not present

## 2023-04-09 DIAGNOSIS — F902 Attention-deficit hyperactivity disorder, combined type: Secondary | ICD-10-CM | POA: Diagnosis not present

## 2023-04-14 DIAGNOSIS — R000 Tachycardia, unspecified: Secondary | ICD-10-CM | POA: Diagnosis not present

## 2023-04-19 DIAGNOSIS — L309 Dermatitis, unspecified: Secondary | ICD-10-CM | POA: Diagnosis not present

## 2023-04-23 DIAGNOSIS — F902 Attention-deficit hyperactivity disorder, combined type: Secondary | ICD-10-CM | POA: Diagnosis not present

## 2023-04-23 DIAGNOSIS — F840 Autistic disorder: Secondary | ICD-10-CM | POA: Diagnosis not present

## 2023-04-23 DIAGNOSIS — F411 Generalized anxiety disorder: Secondary | ICD-10-CM | POA: Diagnosis not present

## 2023-04-26 DIAGNOSIS — M222X2 Patellofemoral disorders, left knee: Secondary | ICD-10-CM | POA: Diagnosis not present

## 2023-04-26 DIAGNOSIS — M222X1 Patellofemoral disorders, right knee: Secondary | ICD-10-CM | POA: Diagnosis not present

## 2023-04-28 DIAGNOSIS — M222X2 Patellofemoral disorders, left knee: Secondary | ICD-10-CM | POA: Diagnosis not present

## 2023-04-28 DIAGNOSIS — M222X1 Patellofemoral disorders, right knee: Secondary | ICD-10-CM | POA: Diagnosis not present

## 2023-04-29 ENCOUNTER — Telehealth: Payer: 59 | Admitting: Family Medicine

## 2023-04-30 DIAGNOSIS — R000 Tachycardia, unspecified: Secondary | ICD-10-CM | POA: Diagnosis not present

## 2023-05-03 ENCOUNTER — Other Ambulatory Visit: Payer: Self-pay | Admitting: Family Medicine

## 2023-05-03 DIAGNOSIS — F5104 Psychophysiologic insomnia: Secondary | ICD-10-CM

## 2023-05-03 DIAGNOSIS — M222X2 Patellofemoral disorders, left knee: Secondary | ICD-10-CM | POA: Diagnosis not present

## 2023-05-03 DIAGNOSIS — M222X1 Patellofemoral disorders, right knee: Secondary | ICD-10-CM | POA: Diagnosis not present

## 2023-05-05 DIAGNOSIS — F331 Major depressive disorder, recurrent, moderate: Secondary | ICD-10-CM | POA: Diagnosis not present

## 2023-05-05 DIAGNOSIS — F902 Attention-deficit hyperactivity disorder, combined type: Secondary | ICD-10-CM | POA: Diagnosis not present

## 2023-05-05 DIAGNOSIS — F5101 Primary insomnia: Secondary | ICD-10-CM | POA: Diagnosis not present

## 2023-05-05 DIAGNOSIS — F411 Generalized anxiety disorder: Secondary | ICD-10-CM | POA: Diagnosis not present

## 2023-05-05 DIAGNOSIS — F840 Autistic disorder: Secondary | ICD-10-CM | POA: Diagnosis not present

## 2023-05-10 DIAGNOSIS — M222X1 Patellofemoral disorders, right knee: Secondary | ICD-10-CM | POA: Diagnosis not present

## 2023-05-10 DIAGNOSIS — M222X2 Patellofemoral disorders, left knee: Secondary | ICD-10-CM | POA: Diagnosis not present

## 2023-05-12 DIAGNOSIS — M222X2 Patellofemoral disorders, left knee: Secondary | ICD-10-CM | POA: Diagnosis not present

## 2023-05-12 DIAGNOSIS — M222X1 Patellofemoral disorders, right knee: Secondary | ICD-10-CM | POA: Diagnosis not present

## 2023-05-17 DIAGNOSIS — R112 Nausea with vomiting, unspecified: Secondary | ICD-10-CM | POA: Diagnosis not present

## 2023-05-17 DIAGNOSIS — R519 Headache, unspecified: Secondary | ICD-10-CM | POA: Diagnosis not present

## 2023-05-19 DIAGNOSIS — F411 Generalized anxiety disorder: Secondary | ICD-10-CM | POA: Diagnosis not present

## 2023-05-19 DIAGNOSIS — F5101 Primary insomnia: Secondary | ICD-10-CM | POA: Diagnosis not present

## 2023-05-19 DIAGNOSIS — F840 Autistic disorder: Secondary | ICD-10-CM | POA: Diagnosis not present

## 2023-05-19 DIAGNOSIS — F331 Major depressive disorder, recurrent, moderate: Secondary | ICD-10-CM | POA: Diagnosis not present

## 2023-05-19 DIAGNOSIS — F902 Attention-deficit hyperactivity disorder, combined type: Secondary | ICD-10-CM | POA: Diagnosis not present

## 2023-05-24 DIAGNOSIS — M222X2 Patellofemoral disorders, left knee: Secondary | ICD-10-CM | POA: Diagnosis not present

## 2023-05-24 DIAGNOSIS — M222X1 Patellofemoral disorders, right knee: Secondary | ICD-10-CM | POA: Diagnosis not present

## 2023-05-26 DIAGNOSIS — F902 Attention-deficit hyperactivity disorder, combined type: Secondary | ICD-10-CM | POA: Diagnosis not present

## 2023-05-26 DIAGNOSIS — F411 Generalized anxiety disorder: Secondary | ICD-10-CM | POA: Diagnosis not present

## 2023-05-26 DIAGNOSIS — F840 Autistic disorder: Secondary | ICD-10-CM | POA: Diagnosis not present

## 2023-05-26 DIAGNOSIS — F5101 Primary insomnia: Secondary | ICD-10-CM | POA: Diagnosis not present

## 2023-05-26 DIAGNOSIS — F331 Major depressive disorder, recurrent, moderate: Secondary | ICD-10-CM | POA: Diagnosis not present

## 2023-05-29 DIAGNOSIS — F331 Major depressive disorder, recurrent, moderate: Secondary | ICD-10-CM | POA: Diagnosis not present

## 2023-05-29 DIAGNOSIS — F840 Autistic disorder: Secondary | ICD-10-CM | POA: Diagnosis not present

## 2023-05-29 DIAGNOSIS — F5101 Primary insomnia: Secondary | ICD-10-CM | POA: Diagnosis not present

## 2023-05-29 DIAGNOSIS — F411 Generalized anxiety disorder: Secondary | ICD-10-CM | POA: Diagnosis not present

## 2023-05-31 DIAGNOSIS — M222X1 Patellofemoral disorders, right knee: Secondary | ICD-10-CM | POA: Diagnosis not present

## 2023-05-31 DIAGNOSIS — M222X2 Patellofemoral disorders, left knee: Secondary | ICD-10-CM | POA: Diagnosis not present

## 2023-06-03 DIAGNOSIS — M222X1 Patellofemoral disorders, right knee: Secondary | ICD-10-CM | POA: Diagnosis not present

## 2023-06-03 DIAGNOSIS — M222X2 Patellofemoral disorders, left knee: Secondary | ICD-10-CM | POA: Diagnosis not present

## 2023-06-07 DIAGNOSIS — M222X2 Patellofemoral disorders, left knee: Secondary | ICD-10-CM | POA: Diagnosis not present

## 2023-06-07 DIAGNOSIS — M222X1 Patellofemoral disorders, right knee: Secondary | ICD-10-CM | POA: Diagnosis not present

## 2023-06-14 DIAGNOSIS — M222X2 Patellofemoral disorders, left knee: Secondary | ICD-10-CM | POA: Diagnosis not present

## 2023-06-14 DIAGNOSIS — M222X1 Patellofemoral disorders, right knee: Secondary | ICD-10-CM | POA: Diagnosis not present

## 2023-06-17 DIAGNOSIS — R42 Dizziness and giddiness: Secondary | ICD-10-CM | POA: Diagnosis not present

## 2023-06-17 DIAGNOSIS — F909 Attention-deficit hyperactivity disorder, unspecified type: Secondary | ICD-10-CM | POA: Diagnosis not present

## 2023-06-17 DIAGNOSIS — R002 Palpitations: Secondary | ICD-10-CM | POA: Diagnosis not present

## 2023-06-17 DIAGNOSIS — Z79899 Other long term (current) drug therapy: Secondary | ICD-10-CM | POA: Diagnosis not present

## 2023-06-17 DIAGNOSIS — R000 Tachycardia, unspecified: Secondary | ICD-10-CM | POA: Diagnosis not present

## 2023-06-23 DIAGNOSIS — F411 Generalized anxiety disorder: Secondary | ICD-10-CM | POA: Diagnosis not present

## 2023-06-23 DIAGNOSIS — F331 Major depressive disorder, recurrent, moderate: Secondary | ICD-10-CM | POA: Diagnosis not present

## 2023-06-23 DIAGNOSIS — F840 Autistic disorder: Secondary | ICD-10-CM | POA: Diagnosis not present

## 2023-06-23 DIAGNOSIS — F5101 Primary insomnia: Secondary | ICD-10-CM | POA: Diagnosis not present

## 2023-06-23 DIAGNOSIS — F902 Attention-deficit hyperactivity disorder, combined type: Secondary | ICD-10-CM | POA: Diagnosis not present

## 2023-06-24 DIAGNOSIS — M222X2 Patellofemoral disorders, left knee: Secondary | ICD-10-CM | POA: Diagnosis not present

## 2023-06-24 DIAGNOSIS — M222X1 Patellofemoral disorders, right knee: Secondary | ICD-10-CM | POA: Diagnosis not present

## 2023-06-26 DIAGNOSIS — F840 Autistic disorder: Secondary | ICD-10-CM | POA: Diagnosis not present

## 2023-06-26 DIAGNOSIS — G90A Postural orthostatic tachycardia syndrome (POTS): Secondary | ICD-10-CM | POA: Diagnosis not present

## 2023-06-26 DIAGNOSIS — D6851 Activated protein C resistance: Secondary | ICD-10-CM | POA: Diagnosis not present

## 2023-06-26 DIAGNOSIS — T50904A Poisoning by unspecified drugs, medicaments and biological substances, undetermined, initial encounter: Secondary | ICD-10-CM | POA: Diagnosis not present

## 2023-06-26 DIAGNOSIS — R112 Nausea with vomiting, unspecified: Secondary | ICD-10-CM | POA: Diagnosis not present

## 2023-06-26 DIAGNOSIS — T40711A Poisoning by cannabis, accidental (unintentional), initial encounter: Secondary | ICD-10-CM | POA: Diagnosis not present

## 2023-06-30 DIAGNOSIS — F5101 Primary insomnia: Secondary | ICD-10-CM | POA: Diagnosis not present

## 2023-06-30 DIAGNOSIS — F411 Generalized anxiety disorder: Secondary | ICD-10-CM | POA: Diagnosis not present

## 2023-06-30 DIAGNOSIS — F902 Attention-deficit hyperactivity disorder, combined type: Secondary | ICD-10-CM | POA: Diagnosis not present

## 2023-06-30 DIAGNOSIS — F840 Autistic disorder: Secondary | ICD-10-CM | POA: Diagnosis not present

## 2023-06-30 DIAGNOSIS — F331 Major depressive disorder, recurrent, moderate: Secondary | ICD-10-CM | POA: Diagnosis not present

## 2023-07-05 DIAGNOSIS — M222X2 Patellofemoral disorders, left knee: Secondary | ICD-10-CM | POA: Diagnosis not present

## 2023-07-05 DIAGNOSIS — M222X1 Patellofemoral disorders, right knee: Secondary | ICD-10-CM | POA: Diagnosis not present

## 2023-07-07 DIAGNOSIS — F5101 Primary insomnia: Secondary | ICD-10-CM | POA: Diagnosis not present

## 2023-07-07 DIAGNOSIS — F331 Major depressive disorder, recurrent, moderate: Secondary | ICD-10-CM | POA: Diagnosis not present

## 2023-07-07 DIAGNOSIS — F411 Generalized anxiety disorder: Secondary | ICD-10-CM | POA: Diagnosis not present

## 2023-07-07 DIAGNOSIS — F902 Attention-deficit hyperactivity disorder, combined type: Secondary | ICD-10-CM | POA: Diagnosis not present

## 2023-07-07 DIAGNOSIS — F840 Autistic disorder: Secondary | ICD-10-CM | POA: Diagnosis not present

## 2023-07-08 DIAGNOSIS — R790 Abnormal level of blood mineral: Secondary | ICD-10-CM | POA: Diagnosis not present

## 2023-07-08 DIAGNOSIS — D72829 Elevated white blood cell count, unspecified: Secondary | ICD-10-CM | POA: Diagnosis not present

## 2023-07-12 DIAGNOSIS — M222X1 Patellofemoral disorders, right knee: Secondary | ICD-10-CM | POA: Diagnosis not present

## 2023-07-12 DIAGNOSIS — M222X2 Patellofemoral disorders, left knee: Secondary | ICD-10-CM | POA: Diagnosis not present

## 2023-07-14 DIAGNOSIS — F840 Autistic disorder: Secondary | ICD-10-CM | POA: Diagnosis not present

## 2023-07-14 DIAGNOSIS — F902 Attention-deficit hyperactivity disorder, combined type: Secondary | ICD-10-CM | POA: Diagnosis not present

## 2023-07-14 DIAGNOSIS — F411 Generalized anxiety disorder: Secondary | ICD-10-CM | POA: Diagnosis not present

## 2023-07-14 DIAGNOSIS — F5101 Primary insomnia: Secondary | ICD-10-CM | POA: Diagnosis not present

## 2023-07-14 DIAGNOSIS — F331 Major depressive disorder, recurrent, moderate: Secondary | ICD-10-CM | POA: Diagnosis not present

## 2023-07-15 DIAGNOSIS — F411 Generalized anxiety disorder: Secondary | ICD-10-CM | POA: Diagnosis not present

## 2023-07-15 DIAGNOSIS — F902 Attention-deficit hyperactivity disorder, combined type: Secondary | ICD-10-CM | POA: Diagnosis not present

## 2023-07-15 DIAGNOSIS — F840 Autistic disorder: Secondary | ICD-10-CM | POA: Diagnosis not present

## 2023-07-15 DIAGNOSIS — F5101 Primary insomnia: Secondary | ICD-10-CM | POA: Diagnosis not present

## 2023-07-15 DIAGNOSIS — F331 Major depressive disorder, recurrent, moderate: Secondary | ICD-10-CM | POA: Diagnosis not present

## 2023-07-19 DIAGNOSIS — M222X1 Patellofemoral disorders, right knee: Secondary | ICD-10-CM | POA: Diagnosis not present

## 2023-07-19 DIAGNOSIS — M222X2 Patellofemoral disorders, left knee: Secondary | ICD-10-CM | POA: Diagnosis not present

## 2023-08-04 DIAGNOSIS — F902 Attention-deficit hyperactivity disorder, combined type: Secondary | ICD-10-CM | POA: Diagnosis not present

## 2023-08-04 DIAGNOSIS — F840 Autistic disorder: Secondary | ICD-10-CM | POA: Diagnosis not present

## 2023-08-04 DIAGNOSIS — F411 Generalized anxiety disorder: Secondary | ICD-10-CM | POA: Diagnosis not present

## 2023-08-04 DIAGNOSIS — F331 Major depressive disorder, recurrent, moderate: Secondary | ICD-10-CM | POA: Diagnosis not present

## 2023-08-04 DIAGNOSIS — F5101 Primary insomnia: Secondary | ICD-10-CM | POA: Diagnosis not present

## 2023-08-06 DIAGNOSIS — M25571 Pain in right ankle and joints of right foot: Secondary | ICD-10-CM | POA: Diagnosis not present

## 2023-08-06 DIAGNOSIS — M25562 Pain in left knee: Secondary | ICD-10-CM | POA: Diagnosis not present

## 2023-08-06 DIAGNOSIS — H53002 Unspecified amblyopia, left eye: Secondary | ICD-10-CM | POA: Diagnosis not present

## 2023-08-06 DIAGNOSIS — M25561 Pain in right knee: Secondary | ICD-10-CM | POA: Diagnosis not present

## 2023-08-09 DIAGNOSIS — M222X1 Patellofemoral disorders, right knee: Secondary | ICD-10-CM | POA: Diagnosis not present

## 2023-08-09 DIAGNOSIS — M222X2 Patellofemoral disorders, left knee: Secondary | ICD-10-CM | POA: Diagnosis not present

## 2023-08-11 DIAGNOSIS — F5101 Primary insomnia: Secondary | ICD-10-CM | POA: Diagnosis not present

## 2023-08-11 DIAGNOSIS — F902 Attention-deficit hyperactivity disorder, combined type: Secondary | ICD-10-CM | POA: Diagnosis not present

## 2023-08-11 DIAGNOSIS — F411 Generalized anxiety disorder: Secondary | ICD-10-CM | POA: Diagnosis not present

## 2023-08-11 DIAGNOSIS — F331 Major depressive disorder, recurrent, moderate: Secondary | ICD-10-CM | POA: Diagnosis not present

## 2023-08-11 DIAGNOSIS — F840 Autistic disorder: Secondary | ICD-10-CM | POA: Diagnosis not present

## 2023-08-12 DIAGNOSIS — S83241A Other tear of medial meniscus, current injury, right knee, initial encounter: Secondary | ICD-10-CM | POA: Diagnosis not present

## 2023-08-12 DIAGNOSIS — S83281D Other tear of lateral meniscus, current injury, right knee, subsequent encounter: Secondary | ICD-10-CM | POA: Diagnosis not present

## 2023-08-12 DIAGNOSIS — R198 Other specified symptoms and signs involving the digestive system and abdomen: Secondary | ICD-10-CM | POA: Diagnosis not present

## 2023-08-19 ENCOUNTER — Other Ambulatory Visit
Admission: RE | Admit: 2023-08-19 | Discharge: 2023-08-19 | Disposition: A | Discharge status: Home / Self Care | Payer: Self-pay | Source: Ambulatory Visit | Attending: Medical Genetics | Admitting: Medical Genetics

## 2023-08-22 DIAGNOSIS — R051 Acute cough: Secondary | ICD-10-CM | POA: Diagnosis not present

## 2023-08-22 DIAGNOSIS — Z753 Unavailability and inaccessibility of health-care facilities: Secondary | ICD-10-CM | POA: Diagnosis not present

## 2023-08-22 DIAGNOSIS — R509 Fever, unspecified: Secondary | ICD-10-CM | POA: Diagnosis not present

## 2023-08-22 DIAGNOSIS — U071 COVID-19: Secondary | ICD-10-CM | POA: Diagnosis not present

## 2023-08-22 DIAGNOSIS — B349 Viral infection, unspecified: Secondary | ICD-10-CM | POA: Diagnosis not present

## 2023-08-26 DIAGNOSIS — R058 Other specified cough: Secondary | ICD-10-CM | POA: Diagnosis not present

## 2023-08-26 DIAGNOSIS — R059 Cough, unspecified: Secondary | ICD-10-CM | POA: Diagnosis not present

## 2023-08-26 DIAGNOSIS — Z8616 Personal history of COVID-19: Secondary | ICD-10-CM | POA: Diagnosis not present

## 2023-08-26 DIAGNOSIS — Z79899 Other long term (current) drug therapy: Secondary | ICD-10-CM | POA: Diagnosis not present

## 2023-08-26 DIAGNOSIS — R0602 Shortness of breath: Secondary | ICD-10-CM | POA: Diagnosis not present

## 2023-08-26 DIAGNOSIS — U071 COVID-19: Secondary | ICD-10-CM | POA: Diagnosis not present

## 2023-08-26 DIAGNOSIS — Z753 Unavailability and inaccessibility of health-care facilities: Secondary | ICD-10-CM | POA: Diagnosis not present

## 2023-08-30 DIAGNOSIS — M222X1 Patellofemoral disorders, right knee: Secondary | ICD-10-CM | POA: Diagnosis not present

## 2023-08-30 DIAGNOSIS — M222X2 Patellofemoral disorders, left knee: Secondary | ICD-10-CM | POA: Diagnosis not present

## 2023-09-01 DIAGNOSIS — R000 Tachycardia, unspecified: Secondary | ICD-10-CM | POA: Diagnosis not present

## 2023-09-01 DIAGNOSIS — F902 Attention-deficit hyperactivity disorder, combined type: Secondary | ICD-10-CM | POA: Diagnosis not present

## 2023-09-01 DIAGNOSIS — F331 Major depressive disorder, recurrent, moderate: Secondary | ICD-10-CM | POA: Diagnosis not present

## 2023-09-01 DIAGNOSIS — R059 Cough, unspecified: Secondary | ICD-10-CM | POA: Diagnosis not present

## 2023-09-01 DIAGNOSIS — F411 Generalized anxiety disorder: Secondary | ICD-10-CM | POA: Diagnosis not present

## 2023-09-01 DIAGNOSIS — R0602 Shortness of breath: Secondary | ICD-10-CM | POA: Diagnosis not present

## 2023-09-01 DIAGNOSIS — R053 Chronic cough: Secondary | ICD-10-CM | POA: Diagnosis not present

## 2023-09-01 DIAGNOSIS — F5101 Primary insomnia: Secondary | ICD-10-CM | POA: Diagnosis not present

## 2023-09-01 DIAGNOSIS — F840 Autistic disorder: Secondary | ICD-10-CM | POA: Diagnosis not present

## 2023-09-02 DIAGNOSIS — R0602 Shortness of breath: Secondary | ICD-10-CM | POA: Diagnosis not present

## 2023-09-02 DIAGNOSIS — R000 Tachycardia, unspecified: Secondary | ICD-10-CM | POA: Diagnosis not present

## 2023-09-02 DIAGNOSIS — R059 Cough, unspecified: Secondary | ICD-10-CM | POA: Diagnosis not present

## 2023-09-03 LAB — GENECONNECT MOLECULAR SCREEN: Genetic Analysis Overall Interpretation: NEGATIVE

## 2023-09-08 DIAGNOSIS — F840 Autistic disorder: Secondary | ICD-10-CM | POA: Diagnosis not present

## 2023-09-08 DIAGNOSIS — F902 Attention-deficit hyperactivity disorder, combined type: Secondary | ICD-10-CM | POA: Diagnosis not present

## 2023-09-08 DIAGNOSIS — F411 Generalized anxiety disorder: Secondary | ICD-10-CM | POA: Diagnosis not present

## 2023-09-08 DIAGNOSIS — F5101 Primary insomnia: Secondary | ICD-10-CM | POA: Diagnosis not present

## 2023-09-08 DIAGNOSIS — F331 Major depressive disorder, recurrent, moderate: Secondary | ICD-10-CM | POA: Diagnosis not present

## 2023-09-24 DIAGNOSIS — F331 Major depressive disorder, recurrent, moderate: Secondary | ICD-10-CM | POA: Diagnosis not present

## 2023-09-24 DIAGNOSIS — F411 Generalized anxiety disorder: Secondary | ICD-10-CM | POA: Diagnosis not present

## 2023-09-24 DIAGNOSIS — F840 Autistic disorder: Secondary | ICD-10-CM | POA: Diagnosis not present

## 2023-09-24 DIAGNOSIS — F902 Attention-deficit hyperactivity disorder, combined type: Secondary | ICD-10-CM | POA: Diagnosis not present

## 2023-09-24 DIAGNOSIS — F5101 Primary insomnia: Secondary | ICD-10-CM | POA: Diagnosis not present

## 2023-10-01 DIAGNOSIS — F902 Attention-deficit hyperactivity disorder, combined type: Secondary | ICD-10-CM | POA: Diagnosis not present

## 2023-10-01 DIAGNOSIS — F331 Major depressive disorder, recurrent, moderate: Secondary | ICD-10-CM | POA: Diagnosis not present

## 2023-10-01 DIAGNOSIS — F5101 Primary insomnia: Secondary | ICD-10-CM | POA: Diagnosis not present

## 2023-10-01 DIAGNOSIS — F840 Autistic disorder: Secondary | ICD-10-CM | POA: Diagnosis not present

## 2023-10-01 DIAGNOSIS — F411 Generalized anxiety disorder: Secondary | ICD-10-CM | POA: Diagnosis not present

## 2023-10-06 DIAGNOSIS — F331 Major depressive disorder, recurrent, moderate: Secondary | ICD-10-CM | POA: Diagnosis not present

## 2023-10-06 DIAGNOSIS — F902 Attention-deficit hyperactivity disorder, combined type: Secondary | ICD-10-CM | POA: Diagnosis not present

## 2023-10-06 DIAGNOSIS — F840 Autistic disorder: Secondary | ICD-10-CM | POA: Diagnosis not present

## 2023-10-06 DIAGNOSIS — F5101 Primary insomnia: Secondary | ICD-10-CM | POA: Diagnosis not present

## 2023-10-06 DIAGNOSIS — F411 Generalized anxiety disorder: Secondary | ICD-10-CM | POA: Diagnosis not present

## 2023-10-08 DIAGNOSIS — F902 Attention-deficit hyperactivity disorder, combined type: Secondary | ICD-10-CM | POA: Diagnosis not present

## 2023-10-08 DIAGNOSIS — F840 Autistic disorder: Secondary | ICD-10-CM | POA: Diagnosis not present

## 2023-10-08 DIAGNOSIS — F411 Generalized anxiety disorder: Secondary | ICD-10-CM | POA: Diagnosis not present

## 2023-10-08 DIAGNOSIS — F331 Major depressive disorder, recurrent, moderate: Secondary | ICD-10-CM | POA: Diagnosis not present

## 2023-10-08 DIAGNOSIS — R198 Other specified symptoms and signs involving the digestive system and abdomen: Secondary | ICD-10-CM | POA: Diagnosis not present

## 2023-10-08 DIAGNOSIS — F5101 Primary insomnia: Secondary | ICD-10-CM | POA: Diagnosis not present

## 2023-10-13 DIAGNOSIS — M222X2 Patellofemoral disorders, left knee: Secondary | ICD-10-CM | POA: Diagnosis not present

## 2023-10-13 DIAGNOSIS — M222X1 Patellofemoral disorders, right knee: Secondary | ICD-10-CM | POA: Diagnosis not present

## 2023-10-15 DIAGNOSIS — F331 Major depressive disorder, recurrent, moderate: Secondary | ICD-10-CM | POA: Diagnosis not present

## 2023-10-15 DIAGNOSIS — F5101 Primary insomnia: Secondary | ICD-10-CM | POA: Diagnosis not present

## 2023-10-15 DIAGNOSIS — F411 Generalized anxiety disorder: Secondary | ICD-10-CM | POA: Diagnosis not present

## 2023-10-15 DIAGNOSIS — F902 Attention-deficit hyperactivity disorder, combined type: Secondary | ICD-10-CM | POA: Diagnosis not present

## 2023-10-15 DIAGNOSIS — F840 Autistic disorder: Secondary | ICD-10-CM | POA: Diagnosis not present

## 2023-10-18 DIAGNOSIS — M222X1 Patellofemoral disorders, right knee: Secondary | ICD-10-CM | POA: Diagnosis not present

## 2023-10-18 DIAGNOSIS — M222X2 Patellofemoral disorders, left knee: Secondary | ICD-10-CM | POA: Diagnosis not present

## 2023-10-20 DIAGNOSIS — T84196A Other mechanical complication of internal fixation device of bone of right lower leg, initial encounter: Secondary | ICD-10-CM | POA: Diagnosis not present

## 2023-10-20 DIAGNOSIS — M25571 Pain in right ankle and joints of right foot: Secondary | ICD-10-CM | POA: Diagnosis not present

## 2023-10-20 DIAGNOSIS — M25562 Pain in left knee: Secondary | ICD-10-CM | POA: Diagnosis not present

## 2023-10-20 DIAGNOSIS — M25561 Pain in right knee: Secondary | ICD-10-CM | POA: Diagnosis not present

## 2023-10-20 DIAGNOSIS — S83204A Other tear of unspecified meniscus, current injury, left knee, initial encounter: Secondary | ICD-10-CM | POA: Diagnosis not present

## 2023-10-25 DIAGNOSIS — M222X2 Patellofemoral disorders, left knee: Secondary | ICD-10-CM | POA: Diagnosis not present

## 2023-11-01 DIAGNOSIS — M25562 Pain in left knee: Secondary | ICD-10-CM | POA: Diagnosis not present

## 2023-11-05 DIAGNOSIS — F331 Major depressive disorder, recurrent, moderate: Secondary | ICD-10-CM | POA: Diagnosis not present

## 2023-11-05 DIAGNOSIS — F902 Attention-deficit hyperactivity disorder, combined type: Secondary | ICD-10-CM | POA: Diagnosis not present

## 2023-11-05 DIAGNOSIS — F411 Generalized anxiety disorder: Secondary | ICD-10-CM | POA: Diagnosis not present

## 2023-11-05 DIAGNOSIS — F840 Autistic disorder: Secondary | ICD-10-CM | POA: Diagnosis not present

## 2023-11-05 DIAGNOSIS — F5101 Primary insomnia: Secondary | ICD-10-CM | POA: Diagnosis not present

## 2023-11-08 DIAGNOSIS — M222X1 Patellofemoral disorders, right knee: Secondary | ICD-10-CM | POA: Diagnosis not present

## 2023-11-08 DIAGNOSIS — M222X2 Patellofemoral disorders, left knee: Secondary | ICD-10-CM | POA: Diagnosis not present

## 2023-11-10 DIAGNOSIS — M222X1 Patellofemoral disorders, right knee: Secondary | ICD-10-CM | POA: Diagnosis not present

## 2023-11-10 DIAGNOSIS — M222X2 Patellofemoral disorders, left knee: Secondary | ICD-10-CM | POA: Diagnosis not present

## 2023-11-17 DIAGNOSIS — M222X1 Patellofemoral disorders, right knee: Secondary | ICD-10-CM | POA: Diagnosis not present

## 2023-11-17 DIAGNOSIS — M222X2 Patellofemoral disorders, left knee: Secondary | ICD-10-CM | POA: Diagnosis not present

## 2023-11-26 DIAGNOSIS — F331 Major depressive disorder, recurrent, moderate: Secondary | ICD-10-CM | POA: Diagnosis not present

## 2023-11-26 DIAGNOSIS — F411 Generalized anxiety disorder: Secondary | ICD-10-CM | POA: Diagnosis not present

## 2023-11-26 DIAGNOSIS — F5101 Primary insomnia: Secondary | ICD-10-CM | POA: Diagnosis not present

## 2023-11-26 DIAGNOSIS — F902 Attention-deficit hyperactivity disorder, combined type: Secondary | ICD-10-CM | POA: Diagnosis not present

## 2023-11-26 DIAGNOSIS — F840 Autistic disorder: Secondary | ICD-10-CM | POA: Diagnosis not present

## 2023-11-29 DIAGNOSIS — M222X1 Patellofemoral disorders, right knee: Secondary | ICD-10-CM | POA: Diagnosis not present

## 2023-11-29 DIAGNOSIS — M222X2 Patellofemoral disorders, left knee: Secondary | ICD-10-CM | POA: Diagnosis not present

## 2023-12-01 DIAGNOSIS — M222X2 Patellofemoral disorders, left knee: Secondary | ICD-10-CM | POA: Diagnosis not present

## 2023-12-01 DIAGNOSIS — M222X1 Patellofemoral disorders, right knee: Secondary | ICD-10-CM | POA: Diagnosis not present

## 2023-12-04 DIAGNOSIS — F331 Major depressive disorder, recurrent, moderate: Secondary | ICD-10-CM | POA: Diagnosis not present

## 2023-12-04 DIAGNOSIS — F411 Generalized anxiety disorder: Secondary | ICD-10-CM | POA: Diagnosis not present

## 2023-12-04 DIAGNOSIS — F902 Attention-deficit hyperactivity disorder, combined type: Secondary | ICD-10-CM | POA: Diagnosis not present

## 2023-12-04 DIAGNOSIS — F5101 Primary insomnia: Secondary | ICD-10-CM | POA: Diagnosis not present

## 2023-12-04 DIAGNOSIS — F840 Autistic disorder: Secondary | ICD-10-CM | POA: Diagnosis not present

## 2023-12-13 DIAGNOSIS — M222X1 Patellofemoral disorders, right knee: Secondary | ICD-10-CM | POA: Diagnosis not present

## 2023-12-13 DIAGNOSIS — M222X2 Patellofemoral disorders, left knee: Secondary | ICD-10-CM | POA: Diagnosis not present

## 2023-12-28 DIAGNOSIS — M25561 Pain in right knee: Secondary | ICD-10-CM | POA: Diagnosis not present

## 2023-12-28 DIAGNOSIS — Z01818 Encounter for other preprocedural examination: Secondary | ICD-10-CM | POA: Diagnosis not present

## 2023-12-28 DIAGNOSIS — T84196A Other mechanical complication of internal fixation device of bone of right lower leg, initial encounter: Secondary | ICD-10-CM | POA: Diagnosis not present

## 2023-12-29 DIAGNOSIS — F902 Attention-deficit hyperactivity disorder, combined type: Secondary | ICD-10-CM | POA: Diagnosis not present

## 2023-12-29 DIAGNOSIS — F411 Generalized anxiety disorder: Secondary | ICD-10-CM | POA: Diagnosis not present

## 2023-12-29 DIAGNOSIS — F331 Major depressive disorder, recurrent, moderate: Secondary | ICD-10-CM | POA: Diagnosis not present

## 2023-12-29 DIAGNOSIS — F5101 Primary insomnia: Secondary | ICD-10-CM | POA: Diagnosis not present

## 2023-12-29 DIAGNOSIS — F840 Autistic disorder: Secondary | ICD-10-CM | POA: Diagnosis not present

## 2023-12-31 DIAGNOSIS — F902 Attention-deficit hyperactivity disorder, combined type: Secondary | ICD-10-CM | POA: Diagnosis not present

## 2023-12-31 DIAGNOSIS — F840 Autistic disorder: Secondary | ICD-10-CM | POA: Diagnosis not present

## 2023-12-31 DIAGNOSIS — F5104 Psychophysiologic insomnia: Secondary | ICD-10-CM | POA: Diagnosis not present

## 2023-12-31 DIAGNOSIS — F5101 Primary insomnia: Secondary | ICD-10-CM | POA: Diagnosis not present

## 2023-12-31 DIAGNOSIS — F411 Generalized anxiety disorder: Secondary | ICD-10-CM | POA: Diagnosis not present

## 2023-12-31 DIAGNOSIS — F331 Major depressive disorder, recurrent, moderate: Secondary | ICD-10-CM | POA: Diagnosis not present

## 2024-01-03 DIAGNOSIS — G90A Postural orthostatic tachycardia syndrome (POTS): Secondary | ICD-10-CM | POA: Diagnosis not present

## 2024-01-03 DIAGNOSIS — R002 Palpitations: Secondary | ICD-10-CM | POA: Diagnosis not present

## 2024-01-03 DIAGNOSIS — R000 Tachycardia, unspecified: Secondary | ICD-10-CM | POA: Diagnosis not present

## 2024-01-03 DIAGNOSIS — F329 Major depressive disorder, single episode, unspecified: Secondary | ICD-10-CM | POA: Diagnosis not present

## 2024-01-03 DIAGNOSIS — G4700 Insomnia, unspecified: Secondary | ICD-10-CM | POA: Diagnosis not present

## 2024-01-03 DIAGNOSIS — M797 Fibromyalgia: Secondary | ICD-10-CM | POA: Diagnosis not present

## 2024-01-03 DIAGNOSIS — E559 Vitamin D deficiency, unspecified: Secondary | ICD-10-CM | POA: Diagnosis not present

## 2024-01-03 DIAGNOSIS — G909 Disorder of the autonomic nervous system, unspecified: Secondary | ICD-10-CM | POA: Diagnosis not present

## 2024-01-03 DIAGNOSIS — F418 Other specified anxiety disorders: Secondary | ICD-10-CM | POA: Diagnosis not present

## 2024-01-03 DIAGNOSIS — F410 Panic disorder [episodic paroxysmal anxiety] without agoraphobia: Secondary | ICD-10-CM | POA: Diagnosis not present

## 2024-01-06 DIAGNOSIS — F840 Autistic disorder: Secondary | ICD-10-CM | POA: Diagnosis not present

## 2024-01-06 DIAGNOSIS — F902 Attention-deficit hyperactivity disorder, combined type: Secondary | ICD-10-CM | POA: Diagnosis not present

## 2024-01-06 DIAGNOSIS — F5101 Primary insomnia: Secondary | ICD-10-CM | POA: Diagnosis not present

## 2024-01-06 DIAGNOSIS — F411 Generalized anxiety disorder: Secondary | ICD-10-CM | POA: Diagnosis not present

## 2024-01-06 DIAGNOSIS — R7401 Elevation of levels of liver transaminase levels: Secondary | ICD-10-CM | POA: Diagnosis not present

## 2024-01-06 DIAGNOSIS — F5104 Psychophysiologic insomnia: Secondary | ICD-10-CM | POA: Diagnosis not present

## 2024-01-06 DIAGNOSIS — Z01818 Encounter for other preprocedural examination: Secondary | ICD-10-CM | POA: Diagnosis not present

## 2024-01-06 DIAGNOSIS — F331 Major depressive disorder, recurrent, moderate: Secondary | ICD-10-CM | POA: Diagnosis not present

## 2024-01-10 DIAGNOSIS — T8484XA Pain due to internal orthopedic prosthetic devices, implants and grafts, initial encounter: Secondary | ICD-10-CM | POA: Diagnosis not present

## 2024-01-10 DIAGNOSIS — T84116A Breakdown (mechanical) of internal fixation device of bone of right lower leg, initial encounter: Secondary | ICD-10-CM | POA: Diagnosis not present

## 2024-01-10 DIAGNOSIS — F418 Other specified anxiety disorders: Secondary | ICD-10-CM | POA: Diagnosis not present

## 2024-01-10 DIAGNOSIS — F909 Attention-deficit hyperactivity disorder, unspecified type: Secondary | ICD-10-CM | POA: Diagnosis not present

## 2024-01-10 DIAGNOSIS — M65961 Unspecified synovitis and tenosynovitis, right lower leg: Secondary | ICD-10-CM | POA: Diagnosis not present

## 2024-01-10 DIAGNOSIS — M67861 Other specified disorders of synovium, right knee: Secondary | ICD-10-CM | POA: Diagnosis not present

## 2024-01-10 DIAGNOSIS — S83231A Complex tear of medial meniscus, current injury, right knee, initial encounter: Secondary | ICD-10-CM | POA: Diagnosis not present

## 2024-01-10 DIAGNOSIS — D6851 Activated protein C resistance: Secondary | ICD-10-CM | POA: Diagnosis not present

## 2024-01-10 DIAGNOSIS — E669 Obesity, unspecified: Secondary | ICD-10-CM | POA: Diagnosis not present

## 2024-01-10 DIAGNOSIS — Z79899 Other long term (current) drug therapy: Secondary | ICD-10-CM | POA: Diagnosis not present

## 2024-01-10 DIAGNOSIS — Z6841 Body Mass Index (BMI) 40.0 and over, adult: Secondary | ICD-10-CM | POA: Diagnosis not present

## 2024-01-12 DIAGNOSIS — T84196A Other mechanical complication of internal fixation device of bone of right lower leg, initial encounter: Secondary | ICD-10-CM | POA: Diagnosis not present

## 2024-01-12 DIAGNOSIS — Z4789 Encounter for other orthopedic aftercare: Secondary | ICD-10-CM | POA: Diagnosis not present

## 2024-01-13 DIAGNOSIS — F902 Attention-deficit hyperactivity disorder, combined type: Secondary | ICD-10-CM | POA: Diagnosis not present

## 2024-01-13 DIAGNOSIS — F840 Autistic disorder: Secondary | ICD-10-CM | POA: Diagnosis not present

## 2024-01-13 DIAGNOSIS — F411 Generalized anxiety disorder: Secondary | ICD-10-CM | POA: Diagnosis not present

## 2024-01-13 DIAGNOSIS — F331 Major depressive disorder, recurrent, moderate: Secondary | ICD-10-CM | POA: Diagnosis not present

## 2024-01-21 DIAGNOSIS — F331 Major depressive disorder, recurrent, moderate: Secondary | ICD-10-CM | POA: Diagnosis not present

## 2024-01-21 DIAGNOSIS — F411 Generalized anxiety disorder: Secondary | ICD-10-CM | POA: Diagnosis not present

## 2024-01-21 DIAGNOSIS — F5104 Psychophysiologic insomnia: Secondary | ICD-10-CM | POA: Diagnosis not present

## 2024-01-21 DIAGNOSIS — F840 Autistic disorder: Secondary | ICD-10-CM | POA: Diagnosis not present

## 2024-01-21 DIAGNOSIS — F902 Attention-deficit hyperactivity disorder, combined type: Secondary | ICD-10-CM | POA: Diagnosis not present

## 2024-01-21 DIAGNOSIS — F5101 Primary insomnia: Secondary | ICD-10-CM | POA: Diagnosis not present
